# Patient Record
Sex: Female | Born: 1970 | Race: White | Hispanic: No | Marital: Married | Smoking: Current every day smoker
Health system: Southern US, Community
[De-identification: ages and names within clinical notes are randomized; demographics above are authoritative.]

## PROBLEM LIST (undated history)

## (undated) DIAGNOSIS — F32A Depression, unspecified: Secondary | ICD-10-CM

## (undated) DIAGNOSIS — F329 Major depressive disorder, single episode, unspecified: Secondary | ICD-10-CM

## (undated) DIAGNOSIS — E785 Hyperlipidemia, unspecified: Secondary | ICD-10-CM

---

## 2003-01-08 ENCOUNTER — Encounter: Payer: Self-pay | Admitting: Internal Medicine

## 2003-01-08 ENCOUNTER — Encounter: Admission: RE | Admit: 2003-01-08 | Discharge: 2003-01-08 | Payer: Self-pay | Admitting: Internal Medicine

## 2003-02-01 ENCOUNTER — Other Ambulatory Visit: Admission: RE | Admit: 2003-02-01 | Discharge: 2003-02-01 | Payer: Self-pay | Admitting: Internal Medicine

## 2004-12-28 ENCOUNTER — Encounter (INDEPENDENT_AMBULATORY_CARE_PROVIDER_SITE_OTHER): Payer: Self-pay | Admitting: *Deleted

## 2004-12-28 ENCOUNTER — Ambulatory Visit (HOSPITAL_COMMUNITY): Admission: AD | Admit: 2004-12-28 | Discharge: 2004-12-29 | Payer: Self-pay | Admitting: Obstetrics and Gynecology

## 2006-02-01 ENCOUNTER — Inpatient Hospital Stay (HOSPITAL_COMMUNITY): Admission: AD | Admit: 2006-02-01 | Discharge: 2006-02-04 | Payer: Self-pay | Admitting: Obstetrics and Gynecology

## 2006-02-02 ENCOUNTER — Encounter (INDEPENDENT_AMBULATORY_CARE_PROVIDER_SITE_OTHER): Payer: Self-pay | Admitting: *Deleted

## 2008-10-03 ENCOUNTER — Inpatient Hospital Stay (HOSPITAL_COMMUNITY): Admission: AD | Admit: 2008-10-03 | Discharge: 2008-10-05 | Payer: Self-pay | Admitting: Obstetrics and Gynecology

## 2010-02-03 ENCOUNTER — Ambulatory Visit (HOSPITAL_COMMUNITY): Admission: RE | Admit: 2010-02-03 | Discharge: 2010-02-03 | Payer: Self-pay | Admitting: Family Medicine

## 2010-04-10 ENCOUNTER — Other Ambulatory Visit (HOSPITAL_COMMUNITY): Payer: Self-pay | Admitting: Family Medicine

## 2010-04-10 DIAGNOSIS — E041 Nontoxic single thyroid nodule: Secondary | ICD-10-CM

## 2010-06-28 LAB — CBC
HCT: 35.6 % — ABNORMAL LOW (ref 36.0–46.0)
HCT: 40.3 % (ref 36.0–46.0)
Hemoglobin: 12.1 g/dL (ref 12.0–15.0)
Hemoglobin: 13.9 g/dL (ref 12.0–15.0)
MCHC: 34.1 g/dL (ref 30.0–36.0)
MCHC: 34.6 g/dL (ref 30.0–36.0)
MCV: 94.2 fL (ref 78.0–100.0)
MCV: 94.5 fL (ref 78.0–100.0)
Platelets: 201 10*3/uL (ref 150–400)
Platelets: 215 10*3/uL (ref 150–400)
RBC: 3.78 MIL/uL — ABNORMAL LOW (ref 3.87–5.11)
RBC: 4.26 MIL/uL (ref 3.87–5.11)
RDW: 14.1 % (ref 11.5–15.5)
RDW: 13.5 % (ref 11.5–15.5)
WBC: 10 10*3/uL (ref 4.0–10.5)
WBC: 12.7 10*3/uL — ABNORMAL HIGH (ref 4.0–10.5)

## 2010-06-28 LAB — RPR: RPR Ser Ql: NONREACTIVE

## 2010-08-04 NOTE — H&P (Signed)
NAMEBRYNNLIE, UNTERREINER                  ACCOUNT NO.:  [id number]   MEDICAL RECORD NO.:  [id number]          PATIENT TYPE:  INP   LOCATION:  9118                          FACILITY:  [facility]   PHYSICIAN:  Lenoard Aden, M.D.DATE OF BIRTH:  07-31-1970   DATE OF ADMISSION:  10/03/2008  DATE OF DISCHARGE:                              HISTORY & PHYSICAL   CHIEF COMPLAINT:  Labor.   HISTORY OF PRESENT ILLNESS:  A 40 year old white female G9, P2, at 89  plus weeks who presents in active labor.   ALLERGIES:  She has no known drug allergies.   MEDICATIONS:  Prenatal vitamins and Lexapro.   PAST MEDICAL HISTORY:  She has a history of depression.   FAMILY HISTORY:  She has a family history of cancer and heart disease.   SOCIAL HISTORY:  She is a nonsmoker, nondrinker.  She denies domestic or  physical violence.   She has a history of two uncomplicated vaginal births and one pregnancy  loss and one TAb.  Her prenatal course is uncomplicated.   PHYSICAL EXAMINATION:  GENERAL:  She is an uncomfortable-appearing white  female in no acute distress.  HEENT:  Normal.  LUNGS:  Clear.  HEART:  Regular rate and rhythm.  ABDOMEN:  Soft, gravid, and nontender.  Estimated fetal weight 8 pounds.  Cervix is per RN, 8 cm, vertex, intact.  EXTREMITIES:  There are no cords.  NEUROLOGIC:  Nonfocal.  SKIN:  Intact.   IMPRESSION:  A 39 weeks intrauterine pregnancy in active labor.   PLAN:  __________      Lenoard Aden, M.D.  Electronically Signed     RJT/MEDQ  D:  10/03/2008  T:  10/03/2008  Job:  [id number]

## 2010-08-05 ENCOUNTER — Other Ambulatory Visit (HOSPITAL_COMMUNITY): Payer: Self-pay

## 2010-08-07 NOTE — Op Note (Signed)
[name], Shawna Martin                  ACCOUNT NO.:  [id number]   MEDICAL RECORD NO.:  [id number]          PATIENT TYPE:  MAT   LOCATION:  MATC                          FACILITY:  [facility]   PHYSICIAN:  Maxie Better, M.D.DATE OF BIRTH:  1970-10-07   DATE OF PROCEDURE:  12/28/2004  DATE OF DISCHARGE:                                 OPERATIVE REPORT   PREOPERATIVE DIAGNOSES:  1.  Missed abortion.  2.  Heavy vaginal bleeding.   OPERATION/PROCEDURE:  Suction dilation and evacuation.   POSTOPERATIVE DIAGNOSES:  1.  Missed abortion.  2.  Heavy vaginal bleeding.   ANESTHESIA:  MAC, paracervical block.   SURGEON:  Maxie Better, M.D.   INDICATIONS:  This is a 40 year old, gravida 2, para 1, female, missed AB  diagnosed today by ultrasound at seven weeks.  Presented with heavy vaginal  bleeding and now for surgical evaluation and management.  The patient was  scheduled for a dilation and evacuation on December 29, 2004.  Risks and  benefits of the procedure had been explained to the patient and her husband.  Consent has been signed.  The patient was transferred to the operating room.  Her blood type is A positive.   DESCRIPTION OF PROCEDURE:  Under adequate monitored anesthesia, the patient  was placed in the dorsal lithotomy position.  She was sterilely prepped and  draped in the usual fashion.  The bladder was catheterized of a large amount  of urine.  A large amount of clots was removed from the vagina.  Examination  under anesthesia revealed an anteverted, eight-week size uterus and no  adnexal masses could be appreciated.  A bivalve speculum was placed in the  vagina and 21 mL of 1% Nesacaine was injected paracervically at  3 o'clock  and 9 o'clock.  The anterior lip of the cervix was grasped with the single-  tooth tenaculum.  The cervix easily accepted the 30-watt Pratt dilator and 8  mm curved suction cannula was introduced into the uterine cavity.  Products  of conception  was obtained.  The cavity was then curetted, suctioned and  curetted until all tissue was felt to be have been removed at which time all  instruments were then removed from the vagina.  Specimen labeled products of  conception was sent to pathology.  Estimated blood loss from the procedure  itself was minimal.  There were no complications.  The patient tolerated the  procedure well and was transferred to the recovery room in stable condition.      Maxie Better, M.D.  Electronically Signed    [place]/MEDQ  D:  12/28/2004  T:  12/29/2004  Job:  [id number]

## 2011-04-05 ENCOUNTER — Other Ambulatory Visit: Payer: Self-pay | Admitting: Obstetrics and Gynecology

## 2011-04-05 DIAGNOSIS — Z1231 Encounter for screening mammogram for malignant neoplasm of breast: Secondary | ICD-10-CM

## 2011-04-22 ENCOUNTER — Ambulatory Visit
Admission: RE | Admit: 2011-04-22 | Discharge: 2011-04-22 | Disposition: A | Discharge status: Home / Self Care | Payer: BC Managed Care – PPO | Source: Ambulatory Visit | Attending: Obstetrics and Gynecology | Admitting: Obstetrics and Gynecology

## 2011-04-22 DIAGNOSIS — Z1231 Encounter for screening mammogram for malignant neoplasm of breast: Secondary | ICD-10-CM

## 2011-05-04 ENCOUNTER — Other Ambulatory Visit: Payer: Self-pay | Admitting: Dermatology

## 2011-06-13 ENCOUNTER — Emergency Department (HOSPITAL_COMMUNITY)
Admission: EM | Admit: 2011-06-13 | Discharge: 2011-06-13 | Disposition: A | Discharge status: Home / Self Care | Payer: BC Managed Care – PPO | Attending: Emergency Medicine | Admitting: Emergency Medicine

## 2011-06-13 ENCOUNTER — Encounter (HOSPITAL_COMMUNITY): Payer: Self-pay

## 2011-06-13 DIAGNOSIS — W261XXA Contact with sword or dagger, initial encounter: Secondary | ICD-10-CM | POA: Insufficient documentation

## 2011-06-13 DIAGNOSIS — S61209A Unspecified open wound of unspecified finger without damage to nail, initial encounter: Secondary | ICD-10-CM | POA: Insufficient documentation

## 2011-06-13 DIAGNOSIS — S61217A Laceration without foreign body of left little finger without damage to nail, initial encounter: Secondary | ICD-10-CM

## 2011-06-13 DIAGNOSIS — Y998 Other external cause status: Secondary | ICD-10-CM | POA: Insufficient documentation

## 2011-06-13 DIAGNOSIS — W260XXA Contact with knife, initial encounter: Secondary | ICD-10-CM | POA: Insufficient documentation

## 2011-06-13 DIAGNOSIS — Y93G1 Activity, food preparation and clean up: Secondary | ICD-10-CM | POA: Insufficient documentation

## 2011-06-13 NOTE — ED Provider Notes (Signed)
History     CSN: [id number]  Arrival date & time 06/13/11  1820   First MD Initiated Contact with Patient 06/13/11 1935      Chief Complaint  Patient presents with  . Laceration    (Consider location/radiation/quality/duration/timing/severity/associated sxs/prior treatment) Patient is a 42 y.o. female presenting with skin laceration. The history is provided by the patient.  Laceration  The incident occurred 1 to 2 hours ago. The laceration is 2 cm in size. The laceration mechanism was a a dirty knife. The pain is at a severity of 2/10.  Pt was cutting chicken and cut her left 5th finger with a knife. Laceration to the tip of the finger, bleeding, gaping, no nail involvement. Bleeding now controlled. No numbness distal to the cut. No other complaints.  History reviewed. No pertinent past medical history.  History reviewed. No pertinent past surgical history.  No family history on file.  History  Substance Use Topics  . Smoking status: Not on file  . Smokeless tobacco: Not on file  . Alcohol Use: Not on file    OB History    Grav Para Term Preterm Abortions TAB SAB Ect Mult Living                  Review of Systems  Constitutional: Negative for fever and chills.  HENT: Negative.   Eyes: Negative.   Respiratory: Negative.   Cardiovascular: Negative.   Gastrointestinal: Negative.   Musculoskeletal: Negative.   Skin: Positive for wound.  Neurological: Negative for weakness and numbness.  Psychiatric/Behavioral: Negative.     Allergies  Review of patient's allergies indicates not on file.  Home Medications  No current outpatient prescriptions on file.  BP 122/74  Pulse 73  Temp(Src) 98.9 F (37.2 C) (Oral)  Resp 20  SpO2 99%  Physical Exam  Nursing note and vitals reviewed. Constitutional: She is oriented to person, place, and time. She appears well-developed and well-nourished.  Neck: Neck supple.  Cardiovascular: Normal rate, regular rhythm and normal  heart sounds.   Pulmonary/Chest: Effort normal and breath sounds normal. No respiratory distress.  Musculoskeletal: Normal range of motion.       See skin exam  Neurological: She is alert and oriented to person, place, and time.  Skin: Skin is warm and dry.       2 cm laceration over the tip/distal left 5th finger, no nail involvement. Good cap refill and sensation distal to the laceration.  Psychiatric: She has a normal mood and affect.    ED Course  Procedures (including critical care time)  LACERATION REPAIR Performed by: Lottie Mussel Authorized by: Jaynie Crumble A Consent: Verbal consent obtained. Risks and benefits: risks, benefits and alternatives were discussed Consent given by: patient Patient identity confirmed: provided demographic data Prepped and Draped in normal sterile fashion Wound explored  Laceration Location: distal left 5th finger  Laceration Length: 2cm  No Foreign Bodies seen or palpated  Anesthesia: local infiltration  Local anesthetic: lidocaine 2% wo epinephrine  Anesthetic total: 1 ml  Irrigation method: syringe Amount of cleaning: standard  Skin closure: prolene 5.0  Number of sutures: 3  Technique: simple interrupted  Patient tolerance: Patient tolerated the procedure well with no immediate complications.   1. Laceration of fifth finger, left       MDM          Lottie Mussel, PA 06/14/11 0124

## 2011-06-13 NOTE — ED Notes (Signed)
Patient here with laceration to left fifth digit on knife. Bleeding controlled with dressing

## 2011-06-13 NOTE — Discharge Instructions (Signed)
Keep finger clean and dry. Apply bacitracin twice a day. Watch for signs of infection. Keep it covered. Follow up in 7 days for suture removal.   Laceration Care, Adult A laceration is a cut or lesion that goes through all layers of the skin and into the tissue just beneath the skin. TREATMENT  Some lacerations may not require closure. Some lacerations may not be able to be closed due to an increased risk of infection. It is important to see your caregiver as soon as possible after an injury to minimize the risk of infection and maximize the opportunity for successful closure. If closure is appropriate, pain medicines may be given, if needed. The wound will be cleaned to help prevent infection. Your caregiver will use stitches (sutures), staples, wound glue (adhesive), or skin adhesive strips to repair the laceration. These tools bring the skin edges together to allow for faster healing and a better cosmetic outcome. However, all wounds will heal with a scar. Once the wound has healed, scarring can be minimized by covering the wound with sunscreen during the day for 1 full year. HOME CARE INSTRUCTIONS  For sutures or staples:  Keep the wound clean and dry.   If you were given a bandage (dressing), you should change it at least once a day. Also, change the dressing if it becomes wet or dirty, or as directed by your caregiver.   Wash the wound with soap and water 2 times a day. Rinse the wound off with water to remove all soap. Pat the wound dry with a clean towel.   After cleaning, apply a thin layer of the antibiotic ointment as recommended by your caregiver. This will help prevent infection and keep the dressing from sticking.   You may shower as usual after the first 24 hours. Do not soak the wound in water until the sutures are removed.   Only take over-the-counter or prescription medicines for pain, discomfort, or fever as directed by your caregiver.   Get your sutures or staples removed as  directed by your caregiver.  For skin adhesive strips:  Keep the wound clean and dry.   Do not get the skin adhesive strips wet. You may bathe carefully, using caution to keep the wound dry.   If the wound gets wet, pat it dry with a clean towel.   Skin adhesive strips will fall off on their own. You may trim the strips as the wound heals. Do not remove skin adhesive strips that are still stuck to the wound. They will fall off in time.  For wound adhesive:  You may briefly wet your wound in the shower or bath. Do not soak or scrub the wound. Do not swim. Avoid periods of heavy perspiration until the skin adhesive has fallen off on its own. After showering or bathing, gently pat the wound dry with a clean towel.   Do not apply liquid medicine, cream medicine, or ointment medicine to your wound while the skin adhesive is in place. This may loosen the film before your wound is healed.   If a dressing is placed over the wound, be careful not to apply tape directly over the skin adhesive. This may cause the adhesive to be pulled off before the wound is healed.   Avoid prolonged exposure to sunlight or tanning lamps while the skin adhesive is in place. Exposure to ultraviolet light in the first year will darken the scar.   The skin adhesive will usually remain in place  for 5 to 10 days, then naturally fall off the skin. Do not pick at the adhesive film.  You may need a tetanus shot if:  You cannot remember when you had your last tetanus shot.   You have never had a tetanus shot.  If you get a tetanus shot, your arm may swell, get red, and feel warm to the touch. This is common and not a problem. If you need a tetanus shot and you choose not to have one, there is a rare chance of getting tetanus. Sickness from tetanus can be serious. SEEK MEDICAL CARE IF:   You have redness, swelling, or increasing pain in the wound.   You see a red line that goes away from the wound.   You have  yellowish-white fluid (pus) coming from the wound.   You have a fever.   You notice a bad smell coming from the wound or dressing.   Your wound breaks open before or after sutures have been removed.   You notice something coming out of the wound such as wood or glass.   Your wound is on your hand or foot and you cannot move a finger or toe.  SEEK IMMEDIATE MEDICAL CARE IF:   Your pain is not controlled with prescribed medicine.   You have severe swelling around the wound causing pain and numbness or a change in color in your arm, hand, leg, or foot.   Your wound splits open and starts bleeding.   You have worsening numbness, weakness, or loss of function of any joint around or beyond the wound.   You develop painful lumps near the wound or on the skin anywhere on your body.  MAKE SURE YOU:   Understand these instructions.   Will watch your condition.   Will get help right away if you are not doing well or get worse.  Document Released: 03/08/2005 Document Revised: 02/25/2011 Document Reviewed: 09/01/2010 [facility] Patient Information 2012 [place], [place].

## 2011-06-16 NOTE — ED Provider Notes (Signed)
Medical screening examination/treatment/procedure(s) were performed by non-physician practitioner and as supervising physician I was immediately available for consultation/collaboration.  Lorann Tani T Detrell Umscheid, MD 06/16/11 1846 

## 2011-07-29 ENCOUNTER — Other Ambulatory Visit: Payer: Self-pay | Admitting: Family Medicine

## 2011-07-29 DIAGNOSIS — E049 Nontoxic goiter, unspecified: Secondary | ICD-10-CM

## 2011-08-05 ENCOUNTER — Ambulatory Visit
Admission: RE | Admit: 2011-08-05 | Discharge: 2011-08-05 | Disposition: A | Discharge status: Home / Self Care | Payer: BC Managed Care – PPO | Source: Ambulatory Visit | Attending: Family Medicine | Admitting: Family Medicine

## 2011-08-05 DIAGNOSIS — E049 Nontoxic goiter, unspecified: Secondary | ICD-10-CM

## 2012-04-06 IMAGING — US US SOFT TISSUE HEAD/NECK
1 series · 14 of 25 positions shown · non-contrast
Comparison: 01/08/2003 by report only

CLINICAL DATA: Thyromegaly and goiter

THYROID ULTRASOUND
TECHNIQUE: Ultrasound examination of the thyroid gland and adjacent
soft tissues was performed.

[Series 1: us soft tissue head/neck · 0.07mm/px · 14 of 48 slices shown]
[im 1/48]
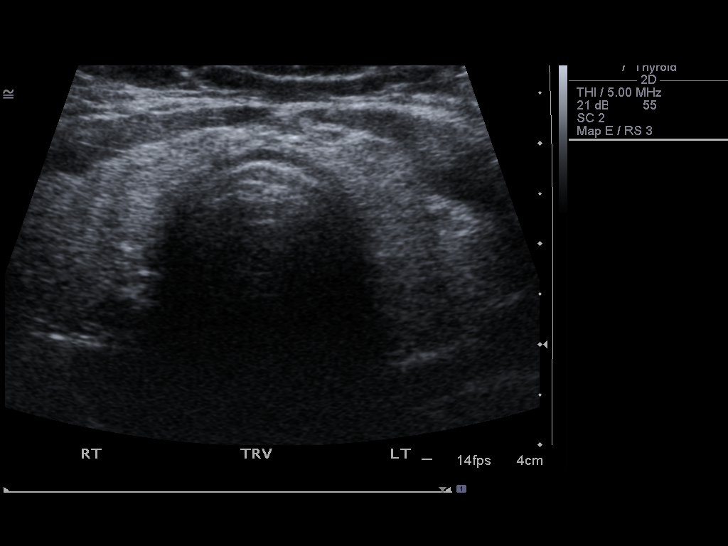
[im 4/48]
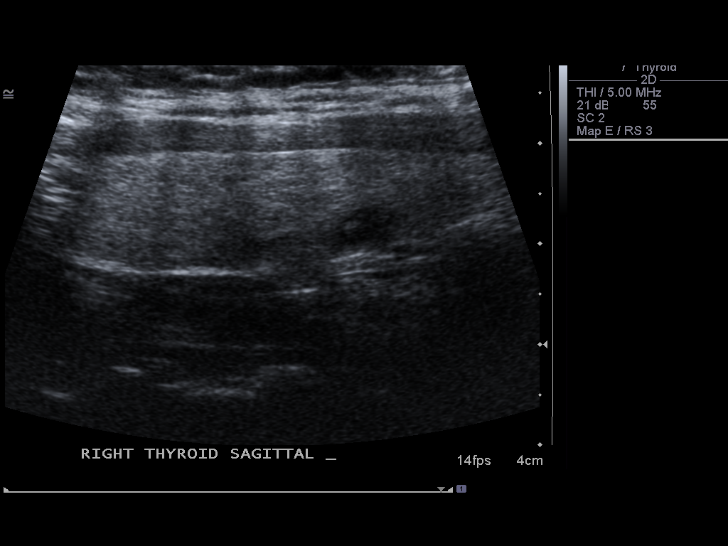
[im 8/48]
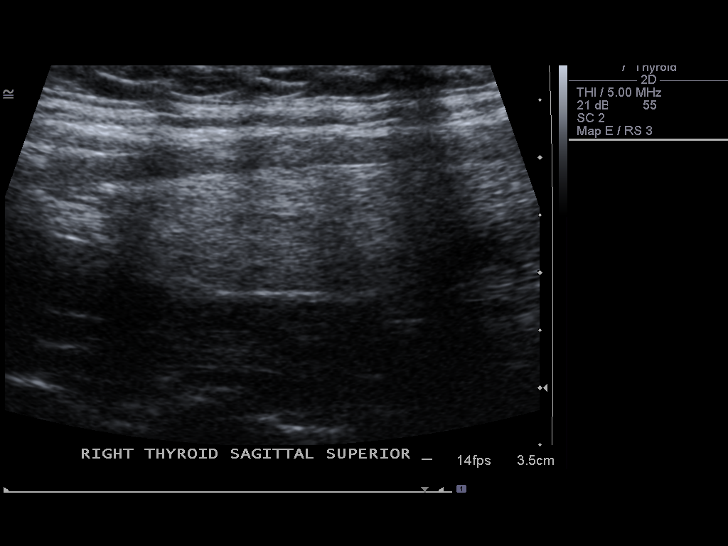
[im 12/48]
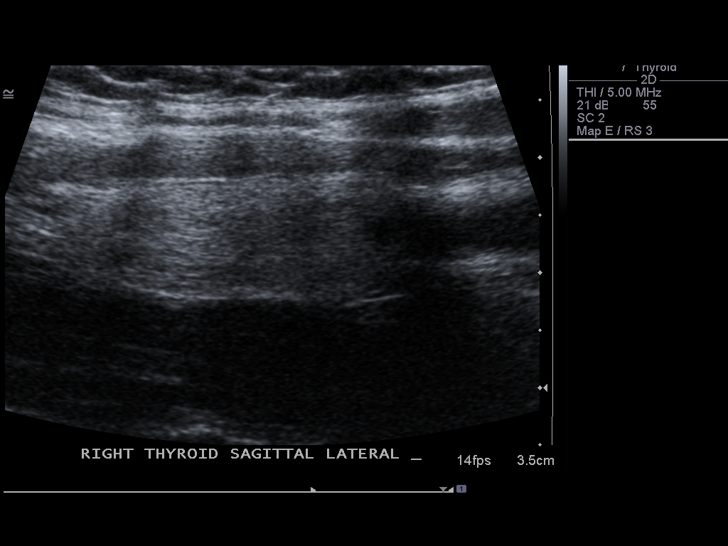
[im 16/48]
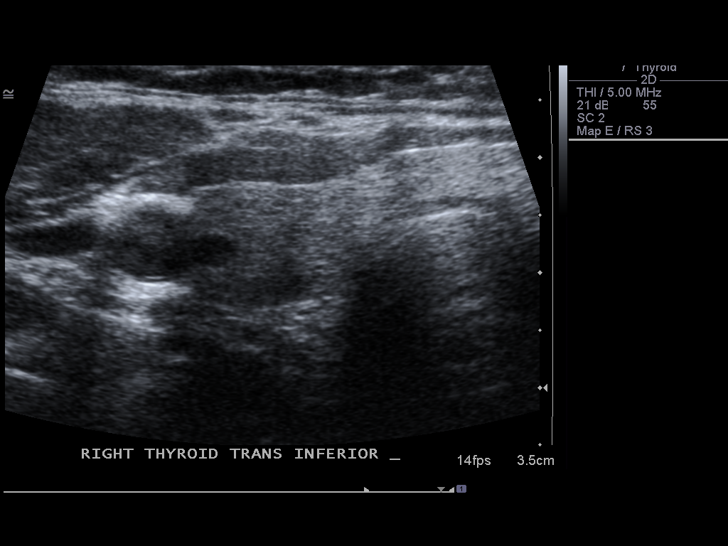
[im 18/48]
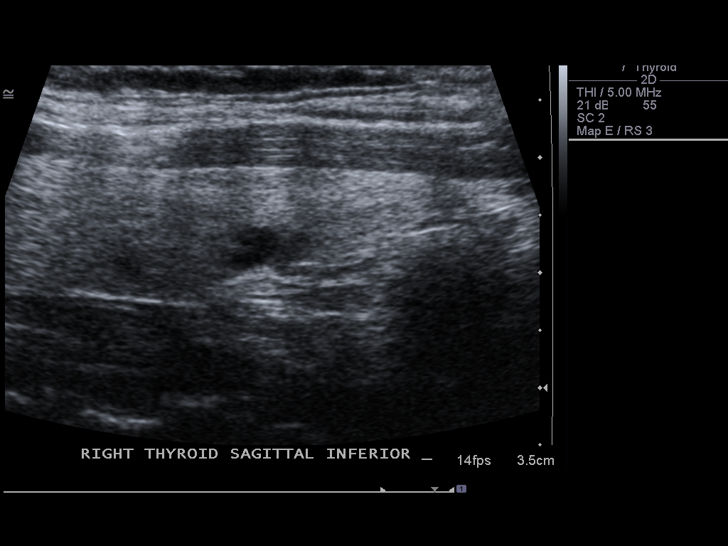
[im 22/48]
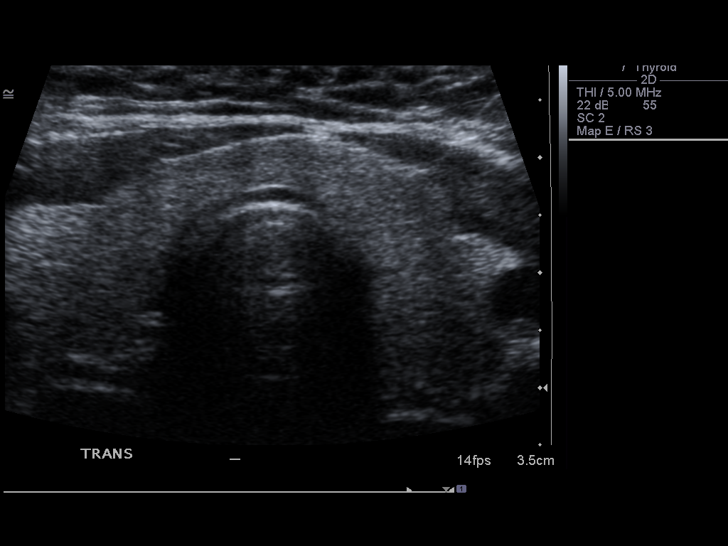
[im 26/48]
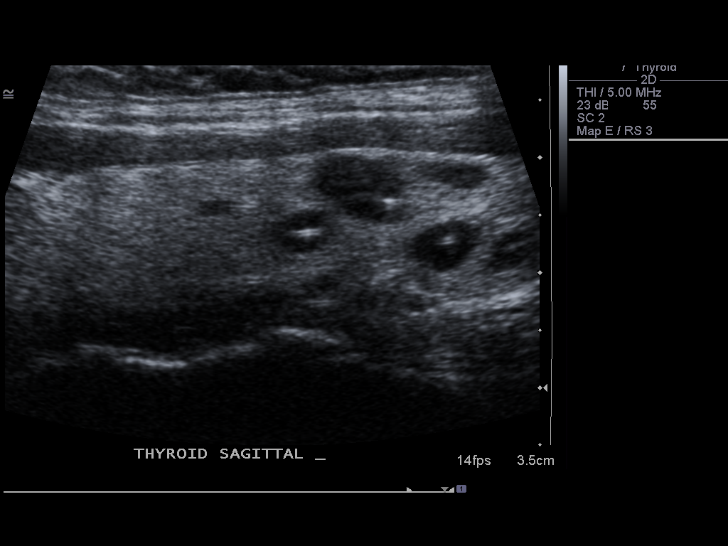
[im 30/48]
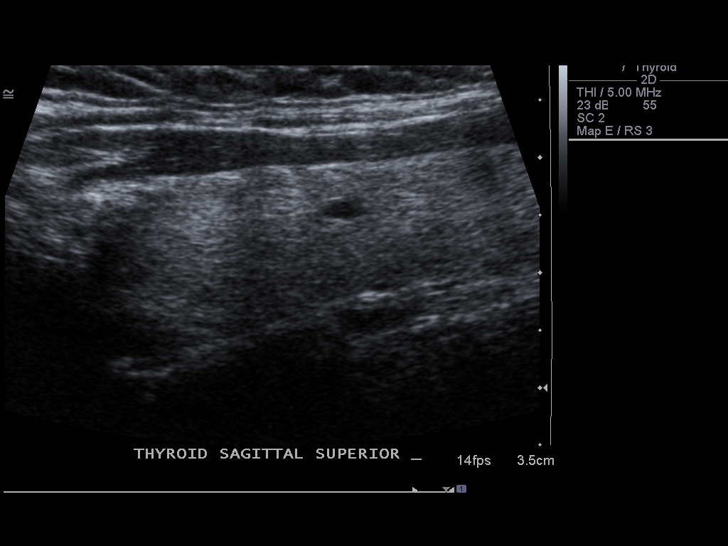
[im 32/48]
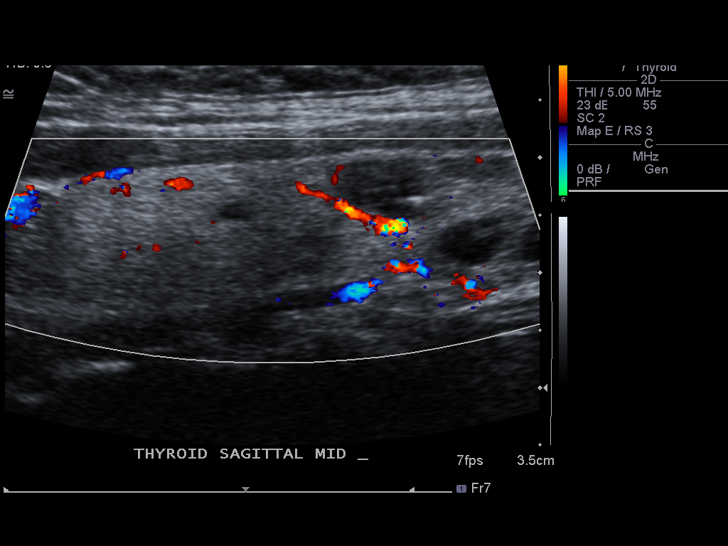
[im 36/48]
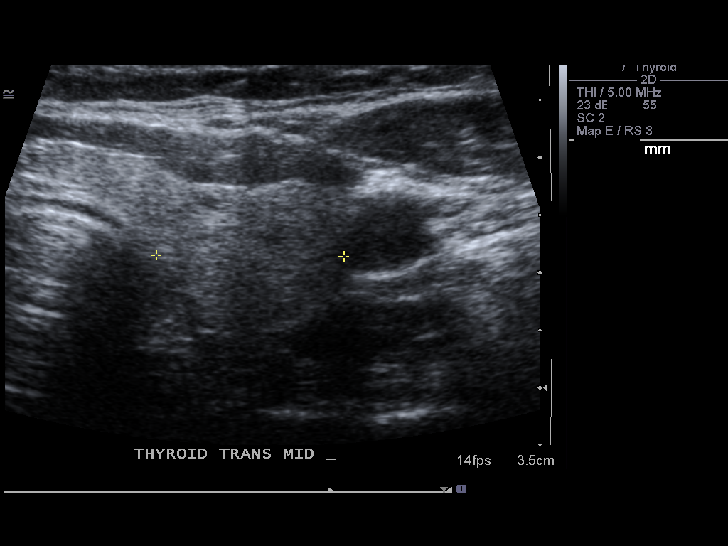
[im 40/48]
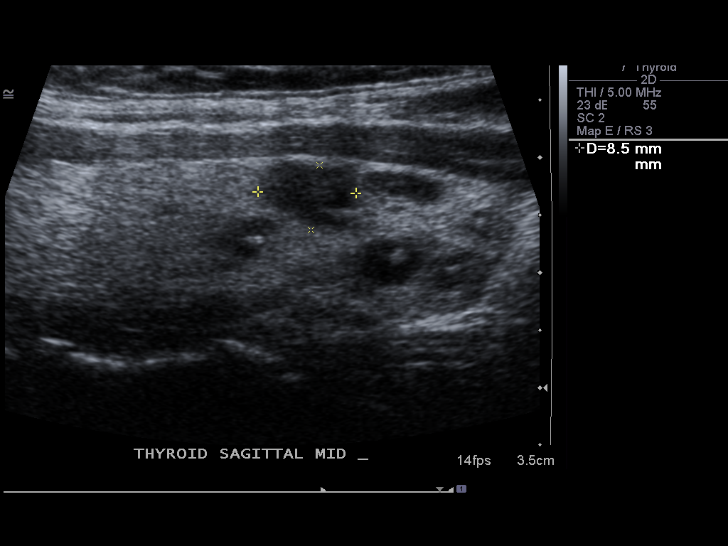
[im 44/48]
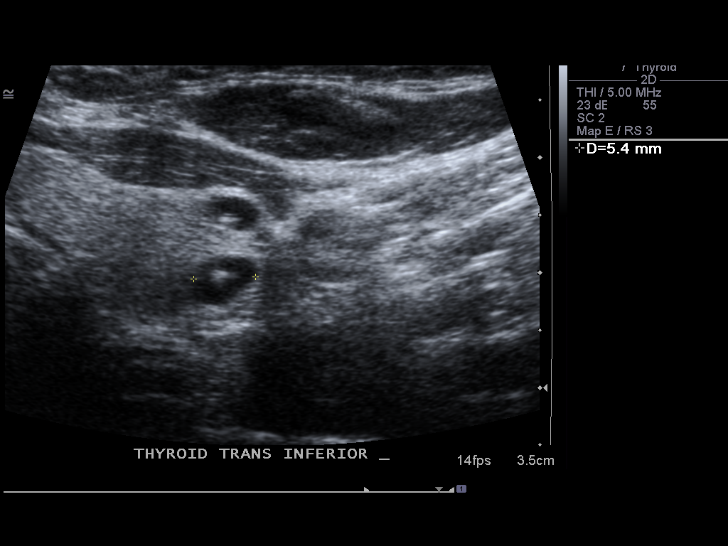
[im 48/48]
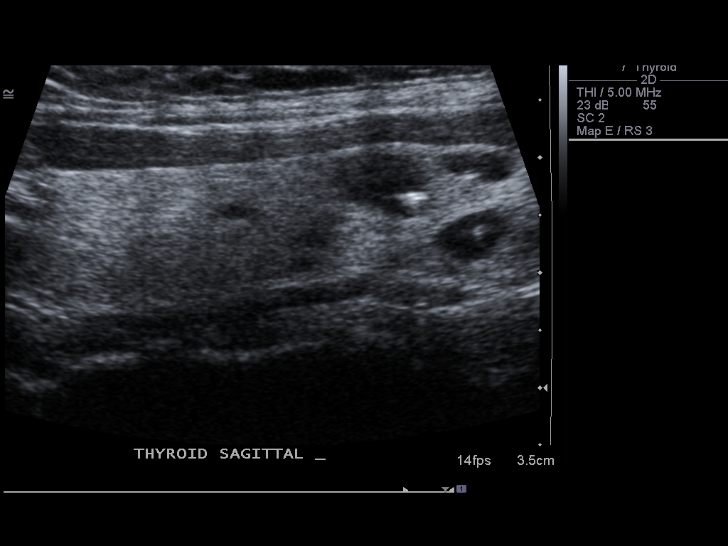

[14 of 25 positions shown; findings below may reference images not displayed]

FINDINGS: Right thyroid lobe:  13 x 17 x 45 mm
Left thyroid lobe:  12 x 16 x 47 mm
Isthmus:  5 mm, unremarkable

Focal nodules:   4 mm hypoechoic or cystic lesion, mid-right
6 x 8 x 9 mm hypoechoic or cystic lesion, mid left
There are several smaller hypoechoic or cystic left lobe lesions
measuring 7mm or less.

Lymphadenopathy:  None visualized.
IMPRESSION: 1.  Bilateral small thyroid nodules or cysts.  None meet consensus
criteria for biopsy.  Continued surveillance recommended. This
recommendation follows the consensus statement:  Management of
Thyroid Nodules Detected as US:  Society of [employer]iologists in
800.  Available online at :
[URL]

## 2012-12-16 ENCOUNTER — Emergency Department (HOSPITAL_COMMUNITY)
Admission: EM | Admit: 2012-12-16 | Discharge: 2012-12-16 | Disposition: A | Discharge status: Home / Self Care | Payer: BC Managed Care – PPO | Source: Home / Self Care | Attending: Emergency Medicine | Admitting: Emergency Medicine

## 2012-12-16 ENCOUNTER — Encounter (HOSPITAL_COMMUNITY): Payer: Self-pay | Admitting: Emergency Medicine

## 2012-12-16 DIAGNOSIS — T63481A Toxic effect of venom of other arthropod, accidental (unintentional), initial encounter: Secondary | ICD-10-CM

## 2012-12-16 DIAGNOSIS — T6391XA Toxic effect of contact with unspecified venomous animal, accidental (unintentional), initial encounter: Secondary | ICD-10-CM

## 2012-12-16 HISTORY — DX: Major depressive disorder, single episode, unspecified: F32.9

## 2012-12-16 HISTORY — DX: Depression, unspecified: F32.A

## 2012-12-16 HISTORY — DX: Hyperlipidemia, unspecified: E78.5

## 2012-12-16 MED ORDER — OXYCODONE-ACETAMINOPHEN 5-325 MG PO TABS: ORAL_TABLET | ORAL | Status: DC

## 2012-12-16 MED ORDER — PREDNISONE 20 MG PO TABS
ORAL_TABLET | ORAL | Status: AC
  Filled 2012-12-16: qty 3

## 2012-12-16 MED ORDER — EPINEPHRINE 0.3 MG/0.3ML IJ SOAJ: 0.3000 mg | Freq: Once | INTRAMUSCULAR | Status: DC

## 2012-12-16 MED ORDER — PREDNISONE 20 MG PO TABS: ORAL_TABLET | ORAL | Status: DC

## 2012-12-16 MED ORDER — PREDNISONE 20 MG PO TABS
60.0000 mg | ORAL_TABLET | Freq: Once | ORAL | Status: AC
  Administered 2012-12-16: 60 mg via ORAL

## 2012-12-16 NOTE — ED Notes (Signed)
Reports being at playground when a wasp flew up under her blouse, stinging her left abdomen approx 4 hrs ago.  Large area of swelling noted; states area is steadily getting worse.

## 2012-12-16 NOTE — ED Provider Notes (Signed)
Chief Complaint:   Chief Complaint  Patient presents with  . Insect Bite    History of Present Illness:   Shawna Martin is a 42 year old female who was stung by a flying insect around 3 PM on the left upper quadrant of her abdomen. She did not see exactly what it was that stung her. It was a small, flying insect. It flew up underneath her blouse. She was at a children's playground when this happened. Right now she has a large, painful, itchy spot in the left upper quadrant of her abdomen. She denies any generalized urticaria or rash, difficulty breathing, tightness, or wheezing. She's had no swelling of the lips, tongue, or throat. The patient states she had a similar insect sting reaction several weeks ago which involved her left leg. Ultimately her entire leg swelled up and she was concerned that the same thing will happen with this sting. She has an appointment to see an allergist, but has not been in yet.  Review of Systems:  Other than noted above, the patient denies any of the following symptoms: Systemic:  No fever, chills, sweats, weight loss, or fatigue. ENT:  No nasal congestion, rhinorrhea, sore throat, swelling of lips, tongue or throat. Resp:  No cough, wheezing, or shortness of breath. Skin:  No rash, itching, nodules, or suspicious lesions.  PMFSH:  Past medical history, family history, social history, meds, and allergies were reviewed. She takes Lexapro and Pravachol. She has elevated cholesterol. She smokes a half a pack of cigarettes a day.  Physical Exam:   Vital signs:  BP 142/88  Pulse 96  Temp(Src) 99.5 F (37.5 C) (Oral)  Resp 20  SpO2 95% Gen:  Alert, oriented, in no distress. ENT:  Pharynx clear, no intraoral lesions, moist mucous membranes. Lungs:  Clear to auscultation. Skin:  In the left upper quadrant of the abdomen there is a 10 x 10 cm, raised, red, slightly tender, erythematous insect staying. No stinger was visible. This was tender to touch. Skin was  otherwise clear and free of any urticaria.  Course in Urgent Care Center:   She has are taking Benadryl 25 mg at home and she was be driving herself home from here. She was given prednisone 60 mg by mouth.  Assessment:  The encounter diagnosis was Hymenoptera sting, initial encounter.  This is a severe local reaction to insect sting. No evidence of systemic symptoms, urticaria, or anaphylaxis.  Plan:   1.  Meds:  The following meds were prescribed:   Discharge Medication List as of 12/16/2012  7:54 PM    START taking these medications   Details  oxyCODONE-acetaminophen (PERCOCET) 5-325 MG per tablet 1 to 2 tablets every 6 hours as needed for pain., Print    predniSONE (DELTASONE) 20 MG tablet 3 daily for 3 days, 2 daily for 3 days, 1 daily for 3 days, Normal       She was also given a prescription for an EpiPen to use as needed.  2.  Patient Education/Counseling:  The patient was given appropriate handouts, self care instructions, and instructed in symptomatic relief.  Suggested rest, application of ice, and to be on the lookout for any evidence of difficulty breathing, urticaria, or swelling of the lips, tongue, or throat.  3.  Follow up:  The patient was told to follow up if no better in 3 to 4 days, if becoming worse in any way, and given some red flag symptoms such as urticaria, difficulty breathing, swelling of  the lips, tongue, throat, or airway area which would prompt immediate return.  Follow up with her allergist.    \  Reuben Likes, MD 12/16/12 2114

## 2013-10-06 IMAGING — US US SOFT TISSUE HEAD/NECK
1 series · 14 of 25 positions shown · non-contrast
Comparison: Ultrasound of the thyroid of 02/03/2010

CLINICAL DATA: Thyroid goiter, follow-up

THYROID ULTRASOUND
TECHNIQUE: Ultrasound examination of the thyroid gland and adjacent
soft tissues was performed.

[Series 1: us soft tissue head/neck · 0.09mm/px · 14 of 62 slices shown]
[im 1/62]
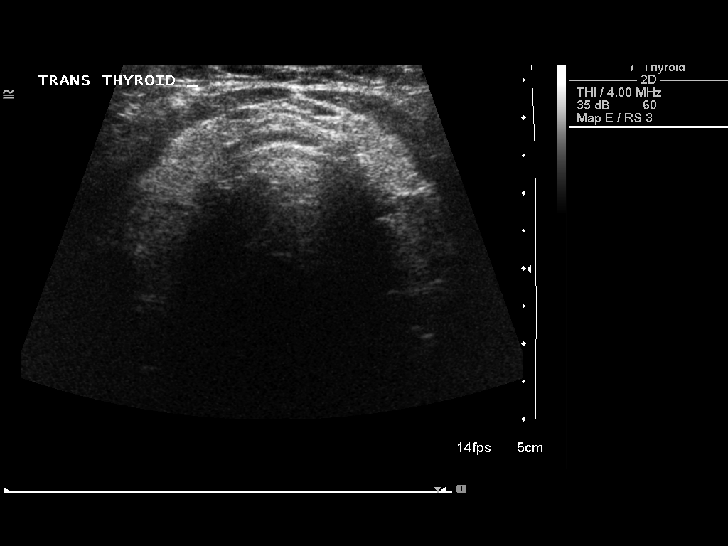
[im 6/62]
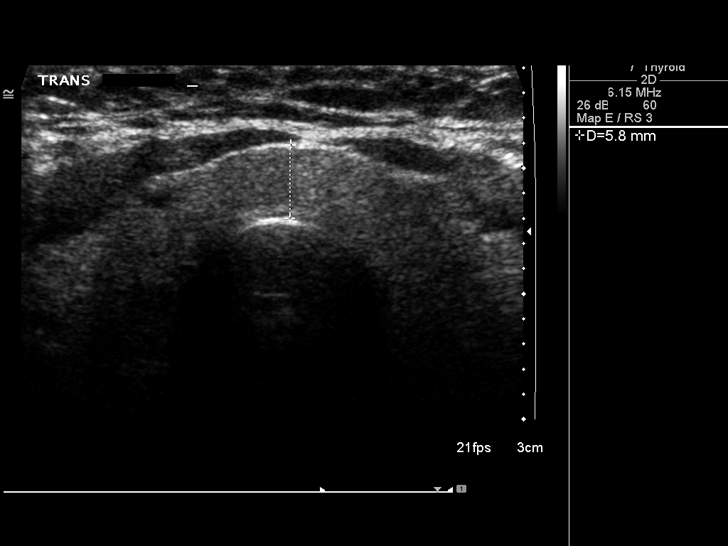
[im 11/62]
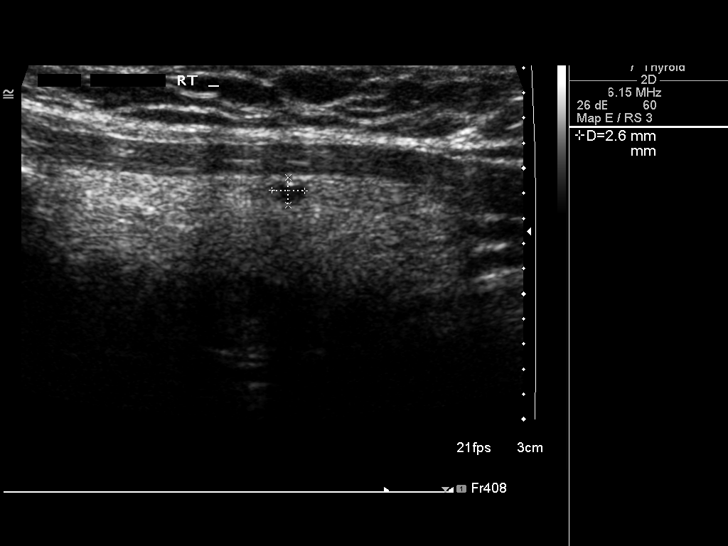
[im 16/62]
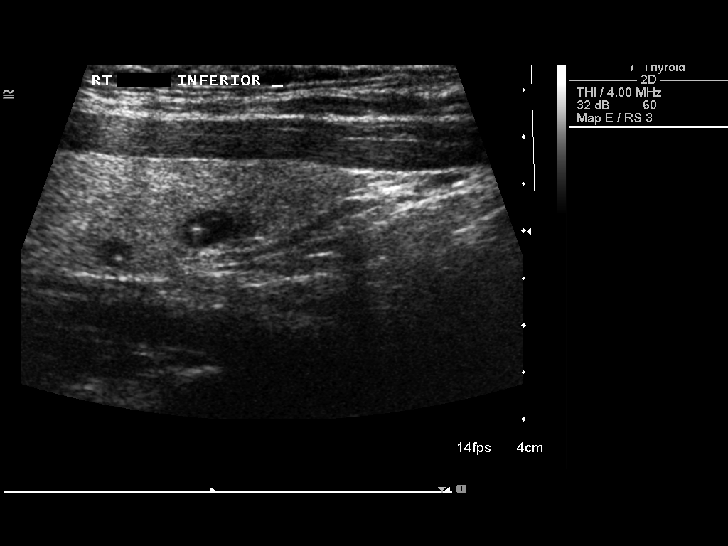
[im 21/62]
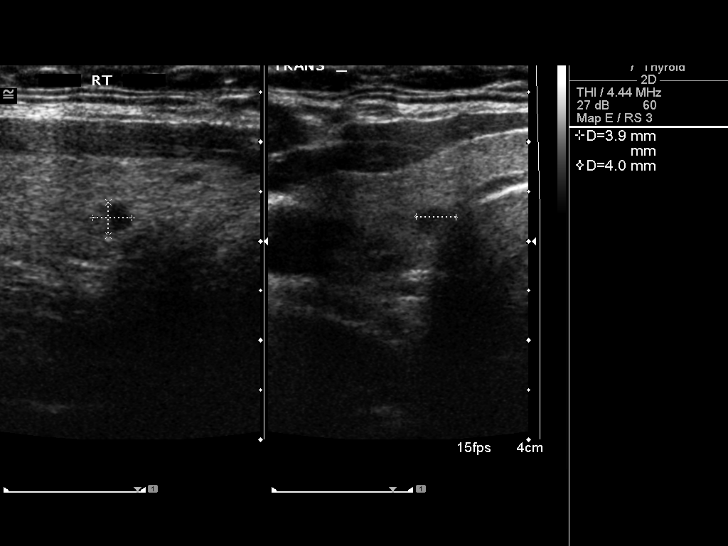
[im 23/62]
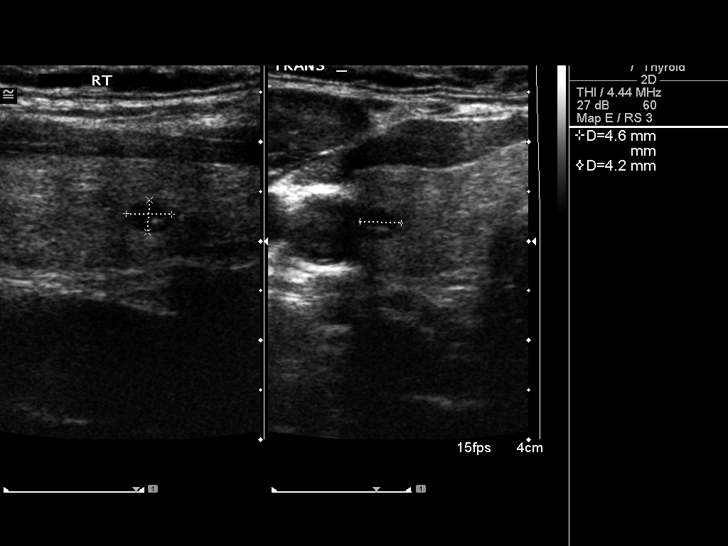
[im 28/62]
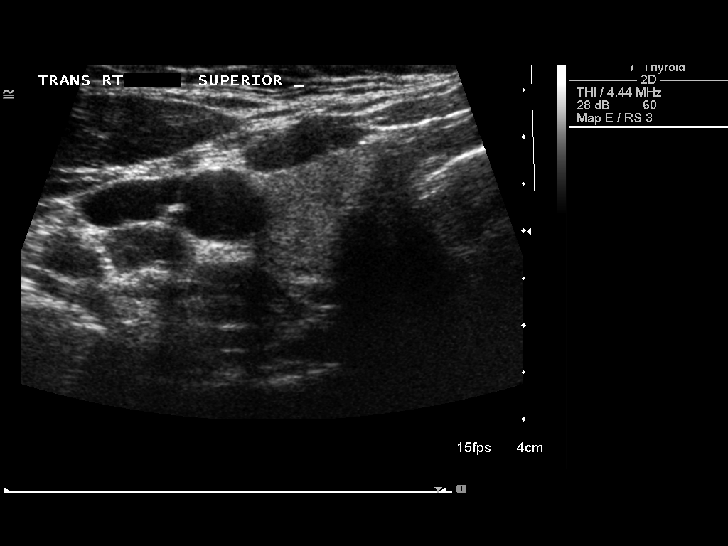
[im 34/62]
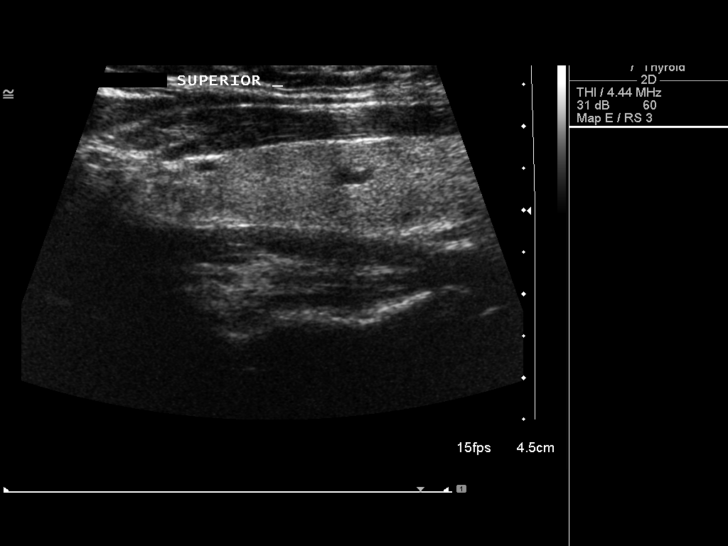
[im 39/62]
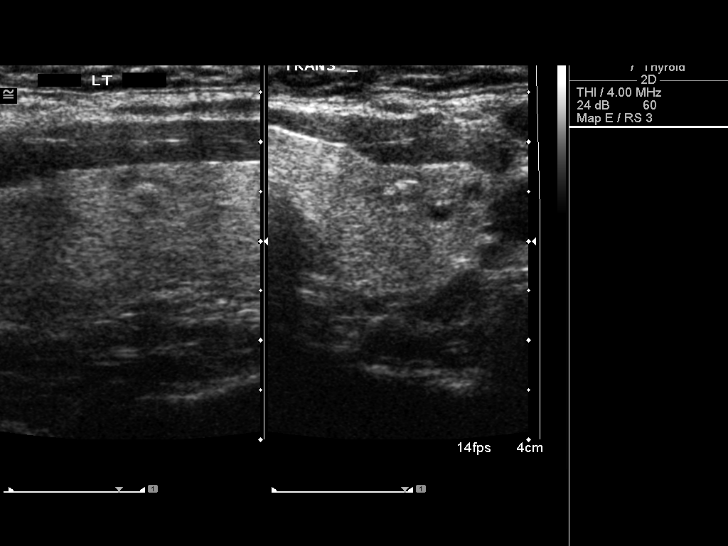
[im 41/62]
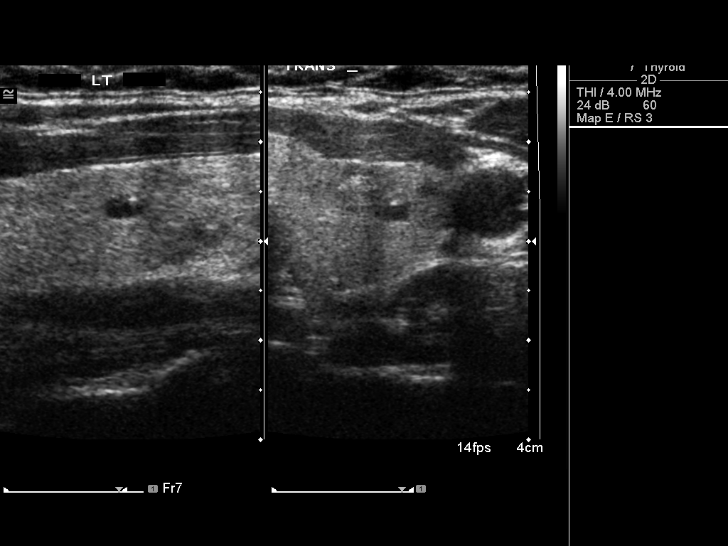
[im 46/62]
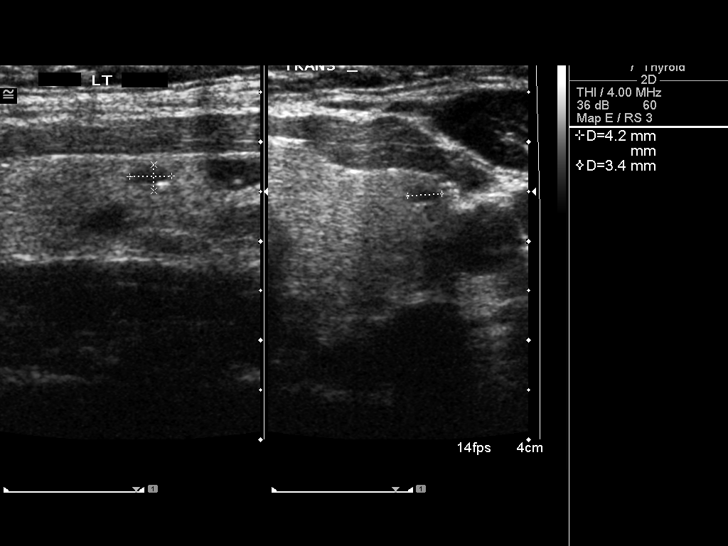
[im 51/62]
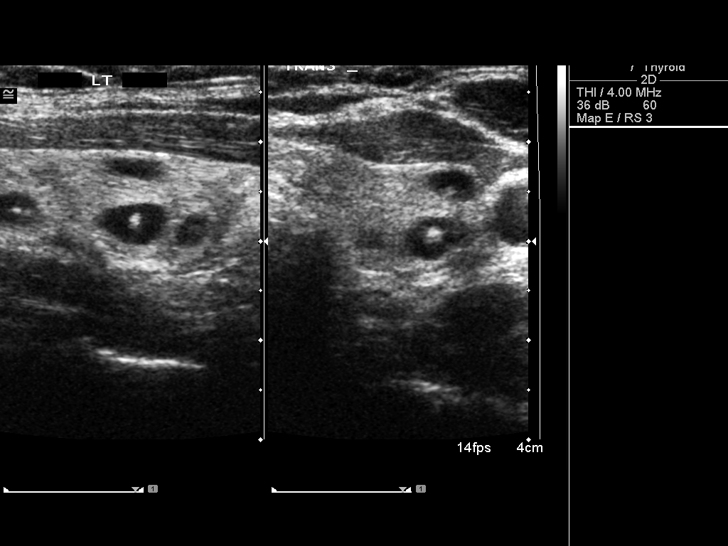
[im 56/62]
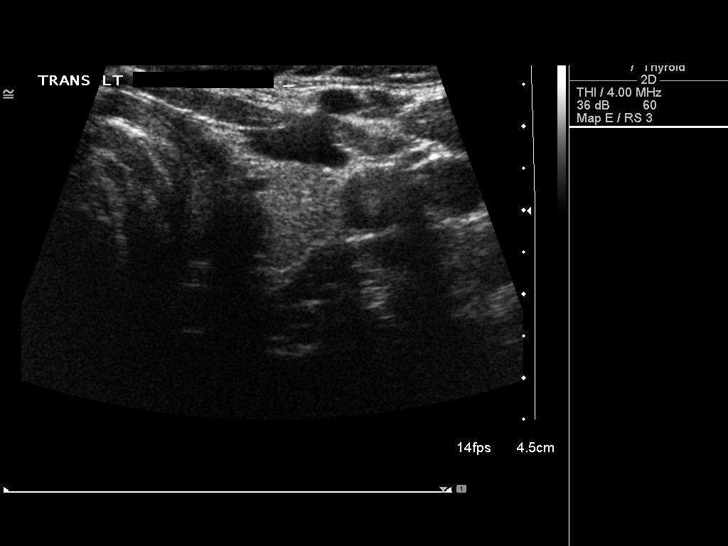
[im 62/62]
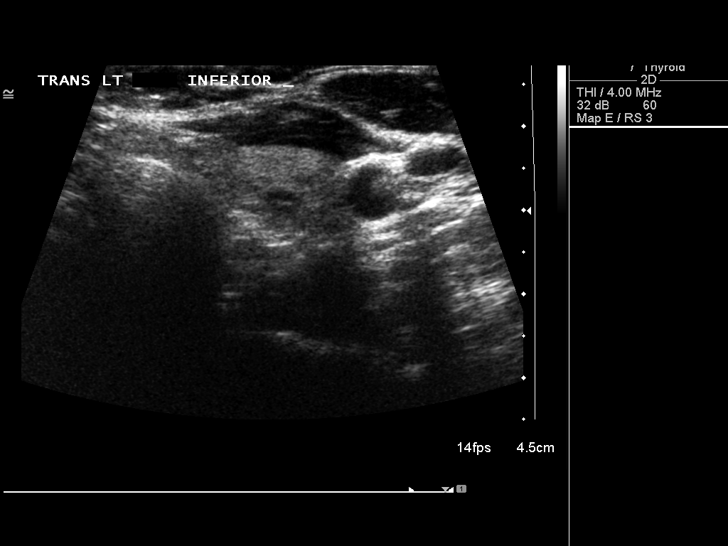

[14 of 25 positions shown; findings below may reference images not displayed]

FINDINGS: Right thyroid lobe:  4.7 x 1.2 x 1.7 cm.  (Previously 4.5 x 1.3 x
1.7 cm).
Left thyroid lobe:  6.0 x 1.2 x 1.9 cm.  (Previously 4.7 x 1.2 x
1.6 cm).
Isthmus:  6 mm in thickness.

Focal nodules:  The echogenicity of the thyroid gland is
inhomogeneous.  There are multiple hypoechoic nodules bilaterally,
none measuring larger than 8 mm in diameter.

Lymphadenopathy:  None visualized.
IMPRESSION: Multiple hypoechoic thyroid nodules of no more than 8 mm in
diameter bilaterally.

## 2016-02-26 ENCOUNTER — Emergency Department (HOSPITAL_COMMUNITY)
Admission: EM | Admit: 2016-02-26 | Discharge: 2016-02-26 | Disposition: A | Discharge status: Home / Self Care | Payer: BLUE CROSS/BLUE SHIELD | Attending: Emergency Medicine | Admitting: Emergency Medicine

## 2016-02-26 DIAGNOSIS — T782XXA Anaphylactic shock, unspecified, initial encounter: Secondary | ICD-10-CM | POA: Diagnosis not present

## 2016-02-26 DIAGNOSIS — F17210 Nicotine dependence, cigarettes, uncomplicated: Secondary | ICD-10-CM | POA: Insufficient documentation

## 2016-02-26 DIAGNOSIS — R300 Dysuria: Secondary | ICD-10-CM | POA: Diagnosis present

## 2016-02-26 LAB — URINALYSIS, ROUTINE W REFLEX MICROSCOPIC
BILIRUBIN URINE: NEGATIVE
Bacteria, UA: NONE SEEN
Glucose, UA: NEGATIVE mg/dL
KETONES UR: NEGATIVE mg/dL
Nitrite: NEGATIVE
Protein, ur: NEGATIVE mg/dL
SPECIFIC GRAVITY, URINE: 1.006 (ref 1.005–1.030)
SQUAMOUS EPITHELIAL / LPF: NONE SEEN
pH: 5 (ref 5.0–8.0)

## 2016-02-26 MED ORDER — ONDANSETRON HCL 4 MG/2ML IJ SOLN
4.0000 mg | Freq: Once | INTRAMUSCULAR | Status: AC
  Administered 2016-02-26: 4 mg via INTRAVENOUS
  Filled 2016-02-26: qty 2

## 2016-02-26 MED ORDER — FAMOTIDINE IN NACL 20-0.9 MG/50ML-% IV SOLN
20.0000 mg | Freq: Once | INTRAVENOUS | Status: AC
  Administered 2016-02-26: 20 mg via INTRAVENOUS
  Filled 2016-02-26: qty 50

## 2016-02-26 MED ORDER — FAMOTIDINE 20 MG PO TABS: 20.0000 mg | ORAL_TABLET | Freq: Two times a day (BID) | ORAL | 0 refills | Status: AC

## 2016-02-26 MED ORDER — EPINEPHRINE 0.3 MG/0.3ML IJ SOAJ
0.3000 mg | Freq: Once | INTRAMUSCULAR | Status: AC
  Administered 2016-02-26: 0.3 mg via INTRAMUSCULAR

## 2016-02-26 MED ORDER — EPINEPHRINE 0.3 MG/0.3ML IJ SOAJ: 0.3000 mg | Freq: Once | INTRAMUSCULAR | 3 refills | Status: AC

## 2016-02-26 MED ORDER — METHYLPREDNISOLONE SODIUM SUCC 125 MG IJ SOLR
INTRAMUSCULAR | Status: AC
  Filled 2016-02-26: qty 2

## 2016-02-26 MED ORDER — FENTANYL CITRATE (PF) 100 MCG/2ML IJ SOLN
50.0000 ug | Freq: Once | INTRAMUSCULAR | Status: AC
  Administered 2016-02-26: 50 ug via INTRAVENOUS
  Filled 2016-02-26: qty 2

## 2016-02-26 MED ORDER — DIPHENHYDRAMINE HCL 50 MG/ML IJ SOLN
50.0000 mg | Freq: Once | INTRAMUSCULAR | Status: AC
  Administered 2016-02-26: 50 mg via INTRAVENOUS
  Filled 2016-02-26: qty 1

## 2016-02-26 MED ORDER — CEPHALEXIN 250 MG PO CAPS
500.0000 mg | ORAL_CAPSULE | Freq: Once | ORAL | Status: AC
  Administered 2016-02-26: 500 mg via ORAL
  Filled 2016-02-26: qty 2

## 2016-02-26 MED ORDER — DIPHENHYDRAMINE HCL 25 MG PO TABS: 25.0000 mg | ORAL_TABLET | Freq: Four times a day (QID) | ORAL | 0 refills | Status: AC

## 2016-02-26 MED ORDER — SODIUM CHLORIDE 0.9 % IV BOLUS (SEPSIS)
1000.0000 mL | Freq: Once | INTRAVENOUS | Status: AC
  Administered 2016-02-26: 1000 mL via INTRAVENOUS

## 2016-02-26 MED ORDER — METHYLPREDNISOLONE SODIUM SUCC 125 MG IJ SOLR
125.0000 mg | Freq: Once | INTRAMUSCULAR | Status: AC
  Administered 2016-02-26: 125 mg via INTRAVENOUS

## 2016-02-26 MED ORDER — PREDNISONE 20 MG PO TABS: 40.0000 mg | ORAL_TABLET | Freq: Every day | ORAL | 0 refills | Status: AC

## 2016-02-26 MED ORDER — EPINEPHRINE 0.3 MG/0.3ML IJ SOAJ
INTRAMUSCULAR | Status: AC
  Filled 2016-02-26: qty 0.3

## 2016-02-26 NOTE — ED Provider Notes (Signed)
[facility]-EMERGENCY DEPT Provider Note   CSN: [id number] Arrival date & time: 02/26/16  [id number]     History   Chief Complaint Chief Complaint  Patient presents with  . Allergic Reaction    HPI Shawna Martin is a 45 y.o. female.  HPI Patient reports this morning she woke up with dysuria and urinary urgency and possible faint discoloration of her urine.  She is concerned about the possibility of a urinary tract infection that she took an old ciprofloxacin that she had a house.  20-30 minutes later she began with itching and swelling in her armpits as well as the palms of her hands.  She then began having difficulty breathing and was brought emergently to the ER by her husband after giving 50 mg of oral Benadryl at home for possible allergic reaction.  No history of anaphylaxis.  Patient is actively vomiting and swollen in the face on arrival to the ER.   Past Medical History:  Diagnosis Date  . Depression   . Hyperlipemia     There are no active problems to display for this patient.   No past surgical history on file.  OB History    No data available       Home Medications    Prior to Admission medications   Medication Sig Start Date End Date Taking? Authorizing Provider  EPINEPHrine (EPIPEN) 0.3 mg/0.3 mL SOAJ injection Inject 0.3 mLs (0.3 mg total) into the skin once. 12/16/12   Reuben Likesavid C Keller, MD  Escitalopram Oxalate (LEXAPRO PO) Take by mouth.    Historical Provider, MD  oxyCODONE-acetaminophen (PERCOCET) 5-325 MG per tablet 1 to 2 tablets every 6 hours as needed for pain. 12/16/12   Reuben Likesavid C Keller, MD  PRAVASTATIN SODIUM PO Take by mouth.    Historical Provider, MD  predniSONE (DELTASONE) 20 MG tablet 3 daily for 3 days, 2 daily for 3 days, 1 daily for 3 days 12/16/12   Reuben Likesavid C Keller, MD    Family History No family history on file.  Social History Social History  Substance Use Topics  . Smoking status: Current Every Day Smoker    Packs/day: 0.50    Types: Cigarettes   . Smokeless tobacco: Not on file  . Alcohol use Yes     Comment: occasional     Allergies   Ciprofloxacin   Review of Systems Review of Systems  All other systems reviewed and are negative.    Physical Exam Updated Vital Signs BP 151/91   Pulse (!) 56   Resp 20   Ht 5\' 8"  (1.727 m)   Wt 160 lb (72.6 kg)   SpO2 94%   BMI 24.33 kg/m   Physical Exam  Constitutional: She is oriented to person, place, and time. She appears well-developed and well-nourished.  HENT:  Head: Normocephalic and atraumatic.  Facial and lip swelling.  Red from the chest up.  Eyes: Pupils are equal, round, and reactive to light.  Cardiovascular: Regular rhythm.   Pulmonary/Chest: No stridor. She is in respiratory distress.  Abdominal: Soft.  Musculoskeletal: Normal range of motion.  Neurological: She is alert and oriented to person, place, and time.  5/5 strength in major muscle groups of  bilateral upper and lower extremities. Speech normal. No facial asymetry.   Skin:  Diffuse hives  Nursing note and vitals reviewed.    ED Treatments / Results  Labs (all labs ordered are listed, but only abnormal results are displayed) Labs Reviewed  URINALYSIS, ROUTINE W REFLEX  MICROSCOPIC    EKG  EKG Interpretation None       Radiology No results found.  Procedures Procedures (including critical care time)   +++++++++++++++++++++++++++++++++++++++++++++++++++++  CRITICAL CARE Performed by: Lyanne CoAMPOS,Donte Kary M Total critical care time: 30 minutes Critical care time was exclusive of separately billable procedures and treating other patients. Critical care was necessary to treat or prevent imminent or life-threatening deterioration. Critical care was time spent personally by me on the following activities: development of treatment plan with patient and/or surrogate as well as nursing, discussions with consultants, evaluation of patient's response to treatment, examination of patient, obtaining  history from patient or surrogate, ordering and performing treatments and interventions, ordering and review of laboratory studies, ordering and review of radiographic studies, pulse oximetry and re-evaluation of patient's condition.  +++++++++++++++++++++++++++++++++++++++++++++++++++++++++++++++  Medications Ordered in ED Medications  EPINEPHrine (EPI-PEN) 0.3 mg/0.3 mL injection (not administered)  methylPREDNISolone sodium succinate (SOLU-MEDROL) 125 mg/2 mL injection (not administered)  EPINEPHrine (EPI-PEN) injection 0.3 mg (0.3 mg Intramuscular Given 02/26/16 0830)  famotidine (PEPCID) IVPB 20 mg premix (0 mg Intravenous Stopped 02/26/16 0939)  methylPREDNISolone sodium succinate (SOLU-MEDROL) 125 mg/2 mL injection 125 mg (125 mg Intravenous Given 02/26/16 0841)  sodium chloride 0.9 % bolus 1,000 mL (1,000 mLs Intravenous New Bag/Given 02/26/16 0847)  ondansetron (ZOFRAN) injection 4 mg (4 mg Intravenous Given 02/26/16 0847)  diphenhydrAMINE (BENADRYL) injection 50 mg (50 mg Intravenous Given 02/26/16 0847)  fentaNYL (SUBLIMAZE) injection 50 mcg (50 mcg Intravenous Given 02/26/16 0937)     Initial Impression / Assessment and Plan / ED Course  I have reviewed the triage vital signs and the nursing notes.  Pertinent labs & imaging results that were available during my care of the patient were reviewed by me and considered in my medical decision making (see chart for details).  Clinical Course    Patient with severe anaphylaxis on arrival.  Patient immediately brought back to the room and assessed and given IM epinephrine in her left lateral thigh.  She began having improvement.  IV Benadryl, IV Pepcid, IV Solu-Medrol.,  Patient remains on the monitor.  Will continue to follow closely.  She seems to be improving  Patient continues to improve this time.  Her breathing is much better.  10:07 AM Still looks good. Will continue to observe in the ER  12:00 PM Patient feels much better at  this time.  Discharge home in good condition with standard medications including an EpiPen.  She understands to return to the ER for new or worsening symptoms   Final Clinical Impressions(s) / ED Diagnoses   Final diagnoses:  Anaphylaxis, initial encounter    New Prescriptions New Prescriptions   No medications on file     Azalia BilisKevin Adelise Buswell, MD 02/26/16 1200

## 2016-02-26 NOTE — ED Triage Notes (Signed)
Patient believed she had uti and took cipro. Patient developed abd pain, oral swelling, difficulty breathing, and nausea. Patient presented with allergic reaction.

## 2017-01-24 DIAGNOSIS — J301 Allergic rhinitis due to pollen: Secondary | ICD-10-CM | POA: Diagnosis not present

## 2017-01-24 DIAGNOSIS — J3089 Other allergic rhinitis: Secondary | ICD-10-CM | POA: Diagnosis not present

## 2017-01-24 DIAGNOSIS — J3081 Allergic rhinitis due to animal (cat) (dog) hair and dander: Secondary | ICD-10-CM | POA: Diagnosis not present

## 2017-01-26 DIAGNOSIS — J301 Allergic rhinitis due to pollen: Secondary | ICD-10-CM | POA: Diagnosis not present

## 2017-01-26 DIAGNOSIS — J3089 Other allergic rhinitis: Secondary | ICD-10-CM | POA: Diagnosis not present

## 2017-01-26 DIAGNOSIS — J3081 Allergic rhinitis due to animal (cat) (dog) hair and dander: Secondary | ICD-10-CM | POA: Diagnosis not present

## 2017-02-04 DIAGNOSIS — J3081 Allergic rhinitis due to animal (cat) (dog) hair and dander: Secondary | ICD-10-CM | POA: Diagnosis not present

## 2017-02-04 DIAGNOSIS — J301 Allergic rhinitis due to pollen: Secondary | ICD-10-CM | POA: Diagnosis not present

## 2017-02-04 DIAGNOSIS — J3089 Other allergic rhinitis: Secondary | ICD-10-CM | POA: Diagnosis not present

## 2017-02-09 DIAGNOSIS — J3081 Allergic rhinitis due to animal (cat) (dog) hair and dander: Secondary | ICD-10-CM | POA: Diagnosis not present

## 2017-02-09 DIAGNOSIS — J301 Allergic rhinitis due to pollen: Secondary | ICD-10-CM | POA: Diagnosis not present

## 2017-02-09 DIAGNOSIS — J3089 Other allergic rhinitis: Secondary | ICD-10-CM | POA: Diagnosis not present

## 2017-02-22 DIAGNOSIS — N3000 Acute cystitis without hematuria: Secondary | ICD-10-CM | POA: Diagnosis not present

## 2017-02-22 DIAGNOSIS — J3089 Other allergic rhinitis: Secondary | ICD-10-CM | POA: Diagnosis not present

## 2017-02-22 DIAGNOSIS — J301 Allergic rhinitis due to pollen: Secondary | ICD-10-CM | POA: Diagnosis not present

## 2017-02-22 DIAGNOSIS — J3081 Allergic rhinitis due to animal (cat) (dog) hair and dander: Secondary | ICD-10-CM | POA: Diagnosis not present

## 2017-03-02 DIAGNOSIS — J3081 Allergic rhinitis due to animal (cat) (dog) hair and dander: Secondary | ICD-10-CM | POA: Diagnosis not present

## 2017-03-02 DIAGNOSIS — J3089 Other allergic rhinitis: Secondary | ICD-10-CM | POA: Diagnosis not present

## 2017-03-02 DIAGNOSIS — J301 Allergic rhinitis due to pollen: Secondary | ICD-10-CM | POA: Diagnosis not present

## 2017-06-16 DIAGNOSIS — Z23 Encounter for immunization: Secondary | ICD-10-CM | POA: Diagnosis not present

## 2017-06-16 DIAGNOSIS — E7800 Pure hypercholesterolemia, unspecified: Secondary | ICD-10-CM | POA: Diagnosis not present

## 2017-06-16 DIAGNOSIS — Z0000 Encounter for general adult medical examination without abnormal findings: Secondary | ICD-10-CM | POA: Diagnosis not present

## 2017-06-16 DIAGNOSIS — E049 Nontoxic goiter, unspecified: Secondary | ICD-10-CM | POA: Diagnosis not present

## 2017-06-17 ENCOUNTER — Other Ambulatory Visit: Payer: Self-pay | Admitting: Family Medicine

## 2017-06-17 DIAGNOSIS — E049 Nontoxic goiter, unspecified: Secondary | ICD-10-CM

## 2017-07-05 ENCOUNTER — Ambulatory Visit
Admission: RE | Admit: 2017-07-05 | Discharge: 2017-07-05 | Disposition: A | Discharge status: Home / Self Care | Payer: 59 | Source: Ambulatory Visit | Attending: Family Medicine | Admitting: Family Medicine

## 2017-07-05 DIAGNOSIS — E041 Nontoxic single thyroid nodule: Secondary | ICD-10-CM | POA: Diagnosis not present

## 2017-07-05 DIAGNOSIS — E049 Nontoxic goiter, unspecified: Secondary | ICD-10-CM

## 2017-08-23 DIAGNOSIS — J209 Acute bronchitis, unspecified: Secondary | ICD-10-CM | POA: Diagnosis not present

## 2017-09-12 DIAGNOSIS — J301 Allergic rhinitis due to pollen: Secondary | ICD-10-CM | POA: Diagnosis not present

## 2017-09-12 DIAGNOSIS — J3081 Allergic rhinitis due to animal (cat) (dog) hair and dander: Secondary | ICD-10-CM | POA: Diagnosis not present

## 2017-09-12 DIAGNOSIS — J3089 Other allergic rhinitis: Secondary | ICD-10-CM | POA: Diagnosis not present

## 2017-11-14 DIAGNOSIS — M25561 Pain in right knee: Secondary | ICD-10-CM | POA: Diagnosis not present

## 2017-11-17 DIAGNOSIS — M25561 Pain in right knee: Secondary | ICD-10-CM | POA: Diagnosis not present

## 2017-11-23 DIAGNOSIS — B373 Candidiasis of vulva and vagina: Secondary | ICD-10-CM | POA: Diagnosis not present

## 2017-11-25 DIAGNOSIS — M25561 Pain in right knee: Secondary | ICD-10-CM | POA: Diagnosis not present

## 2017-11-25 DIAGNOSIS — M25461 Effusion, right knee: Secondary | ICD-10-CM | POA: Diagnosis not present

## 2017-11-28 DIAGNOSIS — M25561 Pain in right knee: Secondary | ICD-10-CM | POA: Diagnosis not present

## 2017-12-19 DIAGNOSIS — F17200 Nicotine dependence, unspecified, uncomplicated: Secondary | ICD-10-CM | POA: Diagnosis not present

## 2017-12-19 DIAGNOSIS — R7301 Impaired fasting glucose: Secondary | ICD-10-CM | POA: Diagnosis not present

## 2017-12-19 DIAGNOSIS — Z23 Encounter for immunization: Secondary | ICD-10-CM | POA: Diagnosis not present

## 2017-12-19 DIAGNOSIS — E7800 Pure hypercholesterolemia, unspecified: Secondary | ICD-10-CM | POA: Diagnosis not present

## 2018-02-09 DIAGNOSIS — M9905 Segmental and somatic dysfunction of pelvic region: Secondary | ICD-10-CM | POA: Diagnosis not present

## 2018-02-09 DIAGNOSIS — M9903 Segmental and somatic dysfunction of lumbar region: Secondary | ICD-10-CM | POA: Diagnosis not present

## 2018-02-09 DIAGNOSIS — M9904 Segmental and somatic dysfunction of sacral region: Secondary | ICD-10-CM | POA: Diagnosis not present

## 2018-02-13 DIAGNOSIS — M9903 Segmental and somatic dysfunction of lumbar region: Secondary | ICD-10-CM | POA: Diagnosis not present

## 2018-02-13 DIAGNOSIS — M9904 Segmental and somatic dysfunction of sacral region: Secondary | ICD-10-CM | POA: Diagnosis not present

## 2018-02-13 DIAGNOSIS — M9905 Segmental and somatic dysfunction of pelvic region: Secondary | ICD-10-CM | POA: Diagnosis not present

## 2018-02-14 DIAGNOSIS — M9905 Segmental and somatic dysfunction of pelvic region: Secondary | ICD-10-CM | POA: Diagnosis not present

## 2018-02-14 DIAGNOSIS — M9904 Segmental and somatic dysfunction of sacral region: Secondary | ICD-10-CM | POA: Diagnosis not present

## 2018-02-14 DIAGNOSIS — M9903 Segmental and somatic dysfunction of lumbar region: Secondary | ICD-10-CM | POA: Diagnosis not present

## 2018-02-15 DIAGNOSIS — M9905 Segmental and somatic dysfunction of pelvic region: Secondary | ICD-10-CM | POA: Diagnosis not present

## 2018-02-15 DIAGNOSIS — M9903 Segmental and somatic dysfunction of lumbar region: Secondary | ICD-10-CM | POA: Diagnosis not present

## 2018-02-15 DIAGNOSIS — M9904 Segmental and somatic dysfunction of sacral region: Secondary | ICD-10-CM | POA: Diagnosis not present

## 2018-02-20 DIAGNOSIS — M9905 Segmental and somatic dysfunction of pelvic region: Secondary | ICD-10-CM | POA: Diagnosis not present

## 2018-02-20 DIAGNOSIS — M9904 Segmental and somatic dysfunction of sacral region: Secondary | ICD-10-CM | POA: Diagnosis not present

## 2018-02-20 DIAGNOSIS — M9903 Segmental and somatic dysfunction of lumbar region: Secondary | ICD-10-CM | POA: Diagnosis not present

## 2018-02-22 DIAGNOSIS — M9905 Segmental and somatic dysfunction of pelvic region: Secondary | ICD-10-CM | POA: Diagnosis not present

## 2018-02-22 DIAGNOSIS — M9904 Segmental and somatic dysfunction of sacral region: Secondary | ICD-10-CM | POA: Diagnosis not present

## 2018-02-22 DIAGNOSIS — M9903 Segmental and somatic dysfunction of lumbar region: Secondary | ICD-10-CM | POA: Diagnosis not present

## 2018-02-28 DIAGNOSIS — M9905 Segmental and somatic dysfunction of pelvic region: Secondary | ICD-10-CM | POA: Diagnosis not present

## 2018-02-28 DIAGNOSIS — M9903 Segmental and somatic dysfunction of lumbar region: Secondary | ICD-10-CM | POA: Diagnosis not present

## 2018-02-28 DIAGNOSIS — M9904 Segmental and somatic dysfunction of sacral region: Secondary | ICD-10-CM | POA: Diagnosis not present

## 2018-03-02 DIAGNOSIS — M9905 Segmental and somatic dysfunction of pelvic region: Secondary | ICD-10-CM | POA: Diagnosis not present

## 2018-03-02 DIAGNOSIS — M9904 Segmental and somatic dysfunction of sacral region: Secondary | ICD-10-CM | POA: Diagnosis not present

## 2018-03-02 DIAGNOSIS — M9903 Segmental and somatic dysfunction of lumbar region: Secondary | ICD-10-CM | POA: Diagnosis not present

## 2018-03-08 DIAGNOSIS — M9905 Segmental and somatic dysfunction of pelvic region: Secondary | ICD-10-CM | POA: Diagnosis not present

## 2018-03-08 DIAGNOSIS — M9903 Segmental and somatic dysfunction of lumbar region: Secondary | ICD-10-CM | POA: Diagnosis not present

## 2018-03-08 DIAGNOSIS — M9904 Segmental and somatic dysfunction of sacral region: Secondary | ICD-10-CM | POA: Diagnosis not present

## 2018-03-16 DIAGNOSIS — L739 Follicular disorder, unspecified: Secondary | ICD-10-CM | POA: Diagnosis not present

## 2018-03-30 DIAGNOSIS — M9904 Segmental and somatic dysfunction of sacral region: Secondary | ICD-10-CM | POA: Diagnosis not present

## 2018-03-30 DIAGNOSIS — M9905 Segmental and somatic dysfunction of pelvic region: Secondary | ICD-10-CM | POA: Diagnosis not present

## 2018-03-30 DIAGNOSIS — M9903 Segmental and somatic dysfunction of lumbar region: Secondary | ICD-10-CM | POA: Diagnosis not present

## 2018-04-06 DIAGNOSIS — M9905 Segmental and somatic dysfunction of pelvic region: Secondary | ICD-10-CM | POA: Diagnosis not present

## 2018-04-06 DIAGNOSIS — M9903 Segmental and somatic dysfunction of lumbar region: Secondary | ICD-10-CM | POA: Diagnosis not present

## 2018-04-06 DIAGNOSIS — M9904 Segmental and somatic dysfunction of sacral region: Secondary | ICD-10-CM | POA: Diagnosis not present

## 2018-05-09 DIAGNOSIS — Z118 Encounter for screening for other infectious and parasitic diseases: Secondary | ICD-10-CM | POA: Diagnosis not present

## 2018-05-09 DIAGNOSIS — N9089 Other specified noninflammatory disorders of vulva and perineum: Secondary | ICD-10-CM | POA: Diagnosis not present

## 2018-05-09 DIAGNOSIS — B373 Candidiasis of vulva and vagina: Secondary | ICD-10-CM | POA: Diagnosis not present

## 2018-05-09 DIAGNOSIS — N760 Acute vaginitis: Secondary | ICD-10-CM | POA: Diagnosis not present

## 2018-05-19 DIAGNOSIS — Z6830 Body mass index (BMI) 30.0-30.9, adult: Secondary | ICD-10-CM | POA: Diagnosis not present

## 2018-05-19 DIAGNOSIS — Z01419 Encounter for gynecological examination (general) (routine) without abnormal findings: Secondary | ICD-10-CM | POA: Diagnosis not present

## 2018-06-29 DIAGNOSIS — Z0000 Encounter for general adult medical examination without abnormal findings: Secondary | ICD-10-CM | POA: Diagnosis not present

## 2018-06-29 DIAGNOSIS — Z6831 Body mass index (BMI) 31.0-31.9, adult: Secondary | ICD-10-CM | POA: Diagnosis not present

## 2018-06-29 DIAGNOSIS — L659 Nonscarring hair loss, unspecified: Secondary | ICD-10-CM | POA: Diagnosis not present

## 2018-06-29 DIAGNOSIS — Z13220 Encounter for screening for lipoid disorders: Secondary | ICD-10-CM | POA: Diagnosis not present

## 2018-06-29 DIAGNOSIS — Z8342 Family history of familial hypercholesterolemia: Secondary | ICD-10-CM | POA: Diagnosis not present

## 2018-06-29 DIAGNOSIS — E559 Vitamin D deficiency, unspecified: Secondary | ICD-10-CM | POA: Diagnosis not present

## 2019-01-15 DIAGNOSIS — M5416 Radiculopathy, lumbar region: Secondary | ICD-10-CM | POA: Diagnosis not present

## 2019-01-15 DIAGNOSIS — M5116 Intervertebral disc disorders with radiculopathy, lumbar region: Secondary | ICD-10-CM | POA: Diagnosis not present

## 2019-01-15 DIAGNOSIS — M79604 Pain in right leg: Secondary | ICD-10-CM | POA: Diagnosis not present

## 2019-01-22 DIAGNOSIS — M5416 Radiculopathy, lumbar region: Secondary | ICD-10-CM | POA: Diagnosis not present

## 2019-01-22 DIAGNOSIS — M5116 Intervertebral disc disorders with radiculopathy, lumbar region: Secondary | ICD-10-CM | POA: Diagnosis not present

## 2019-01-22 DIAGNOSIS — M79604 Pain in right leg: Secondary | ICD-10-CM | POA: Diagnosis not present

## 2019-01-29 DIAGNOSIS — M5416 Radiculopathy, lumbar region: Secondary | ICD-10-CM | POA: Diagnosis not present

## 2019-01-29 DIAGNOSIS — M79604 Pain in right leg: Secondary | ICD-10-CM | POA: Diagnosis not present

## 2019-01-29 DIAGNOSIS — M5116 Intervertebral disc disorders with radiculopathy, lumbar region: Secondary | ICD-10-CM | POA: Diagnosis not present

## 2019-02-05 DIAGNOSIS — M5416 Radiculopathy, lumbar region: Secondary | ICD-10-CM | POA: Diagnosis not present

## 2019-02-05 DIAGNOSIS — M5116 Intervertebral disc disorders with radiculopathy, lumbar region: Secondary | ICD-10-CM | POA: Diagnosis not present

## 2019-02-05 DIAGNOSIS — M79604 Pain in right leg: Secondary | ICD-10-CM | POA: Diagnosis not present

## 2019-02-26 DIAGNOSIS — M545 Low back pain: Secondary | ICD-10-CM | POA: Diagnosis not present

## 2019-02-26 DIAGNOSIS — M5416 Radiculopathy, lumbar region: Secondary | ICD-10-CM | POA: Diagnosis not present

## 2019-02-26 DIAGNOSIS — M5116 Intervertebral disc disorders with radiculopathy, lumbar region: Secondary | ICD-10-CM | POA: Diagnosis not present

## 2019-03-09 DIAGNOSIS — M5116 Intervertebral disc disorders with radiculopathy, lumbar region: Secondary | ICD-10-CM | POA: Diagnosis not present

## 2019-03-09 DIAGNOSIS — M5416 Radiculopathy, lumbar region: Secondary | ICD-10-CM | POA: Diagnosis not present

## 2019-03-09 DIAGNOSIS — M545 Low back pain: Secondary | ICD-10-CM | POA: Diagnosis not present

## 2019-03-22 DIAGNOSIS — M5116 Intervertebral disc disorders with radiculopathy, lumbar region: Secondary | ICD-10-CM | POA: Diagnosis not present

## 2019-03-22 DIAGNOSIS — M545 Low back pain: Secondary | ICD-10-CM | POA: Diagnosis not present

## 2019-03-22 DIAGNOSIS — M5416 Radiculopathy, lumbar region: Secondary | ICD-10-CM | POA: Diagnosis not present

## 2019-04-04 DIAGNOSIS — M5416 Radiculopathy, lumbar region: Secondary | ICD-10-CM | POA: Diagnosis not present

## 2019-04-04 DIAGNOSIS — M545 Low back pain: Secondary | ICD-10-CM | POA: Diagnosis not present

## 2019-04-04 DIAGNOSIS — M5116 Intervertebral disc disorders with radiculopathy, lumbar region: Secondary | ICD-10-CM | POA: Diagnosis not present

## 2019-04-30 DIAGNOSIS — M545 Low back pain: Secondary | ICD-10-CM | POA: Diagnosis not present

## 2019-04-30 DIAGNOSIS — M5116 Intervertebral disc disorders with radiculopathy, lumbar region: Secondary | ICD-10-CM | POA: Diagnosis not present

## 2019-04-30 DIAGNOSIS — M5416 Radiculopathy, lumbar region: Secondary | ICD-10-CM | POA: Diagnosis not present

## 2019-06-11 DIAGNOSIS — F17200 Nicotine dependence, unspecified, uncomplicated: Secondary | ICD-10-CM | POA: Diagnosis not present

## 2019-06-11 DIAGNOSIS — F418 Other specified anxiety disorders: Secondary | ICD-10-CM | POA: Diagnosis not present

## 2019-06-11 DIAGNOSIS — E7800 Pure hypercholesterolemia, unspecified: Secondary | ICD-10-CM | POA: Diagnosis not present

## 2019-06-11 DIAGNOSIS — G43109 Migraine with aura, not intractable, without status migrainosus: Secondary | ICD-10-CM | POA: Diagnosis not present

## 2019-07-01 IMAGING — US US THYROID
1 series · 14 of 25 positions shown · non-contrast
Comparison: 08/05/2011

CLINICAL DATA: Prior ultrasound follow-up.  Follow-up nodule.

EXAM:
THYROID ULTRASOUND
TECHNIQUE: Ultrasound examination of the thyroid gland and adjacent soft
tissues was performed.

[Series 1: us thyroid · 0.07mm/px · 14 of 55 slices shown]
[im 1/55]
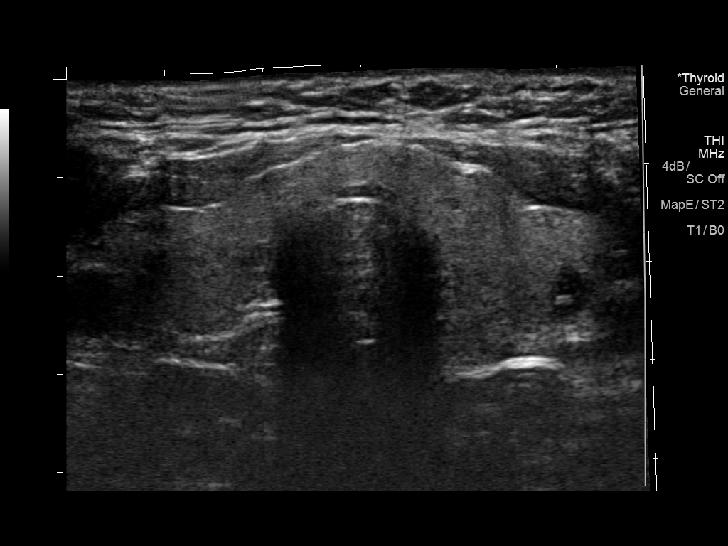
[im 5/55]
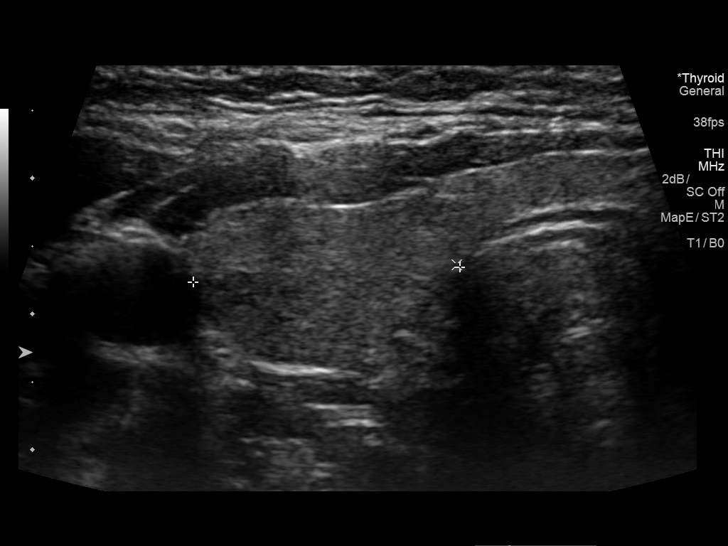
[im 10/55]
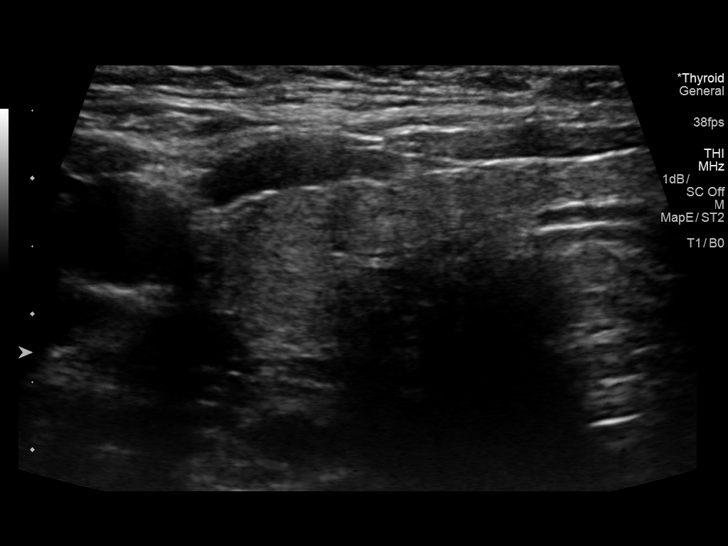
[im 14/55]
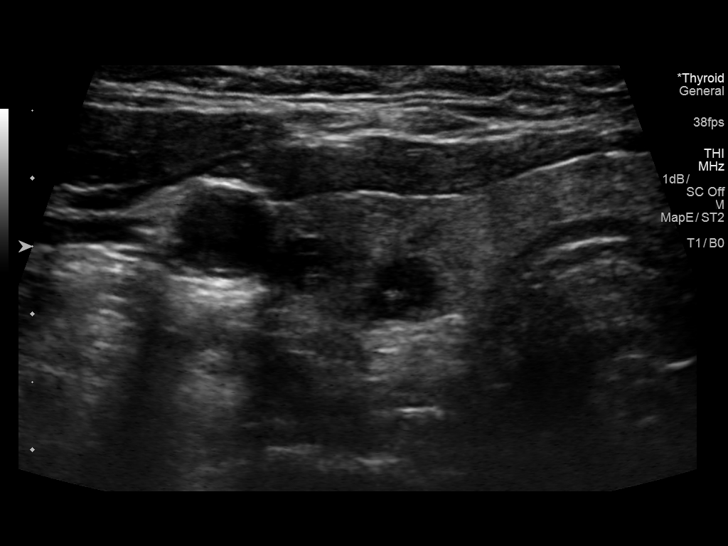
[im 19/55]
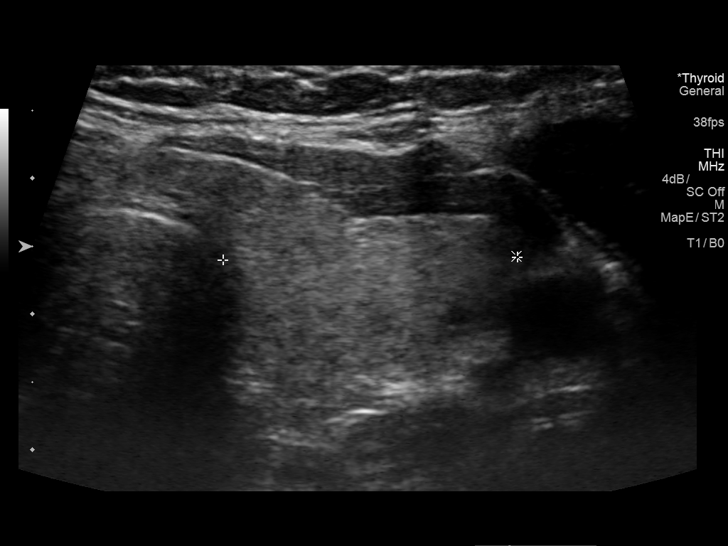
[im 21/55]
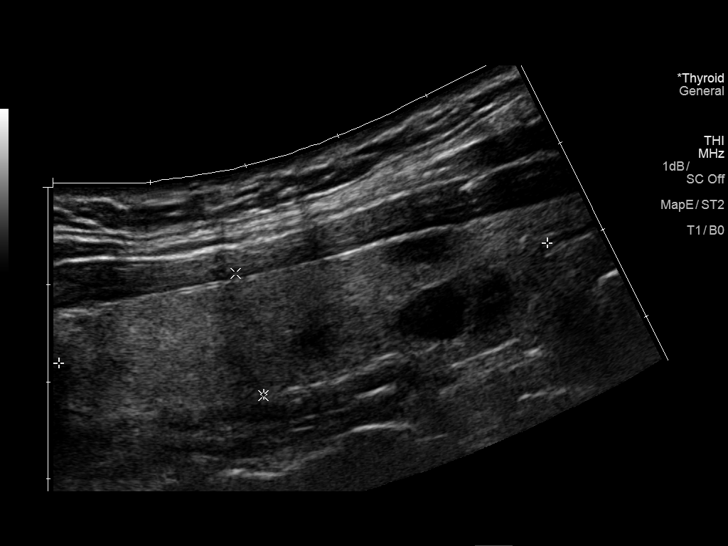
[im 25/55]
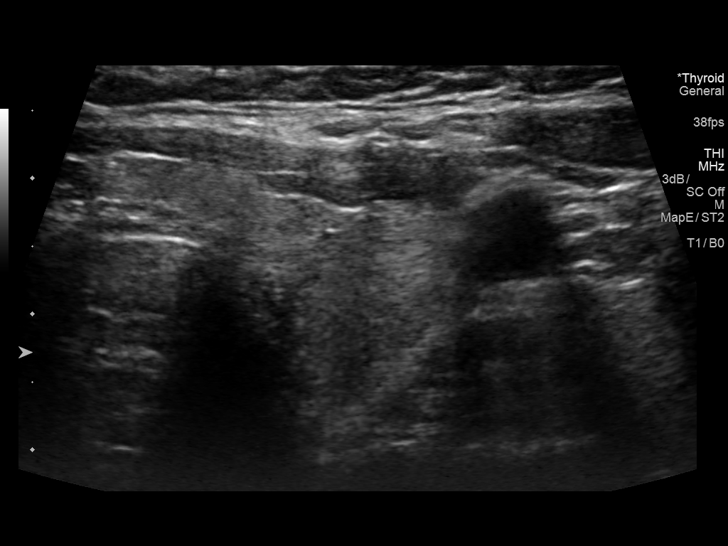
[im 30/55]
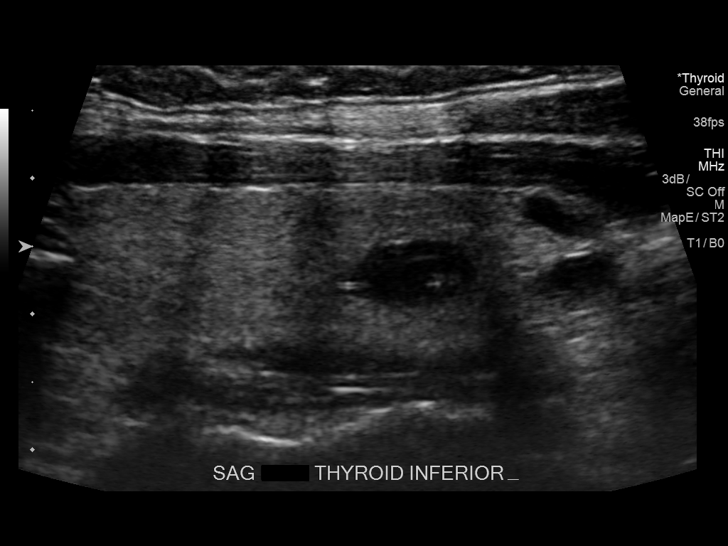
[im 34/55]
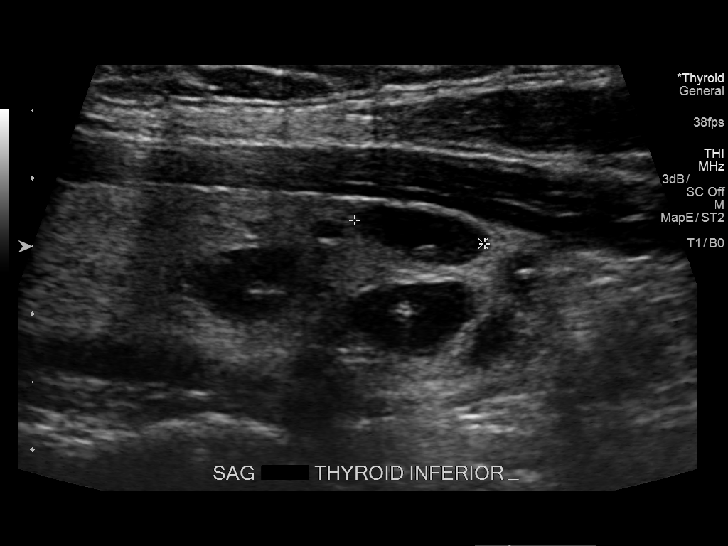
[im 37/55]
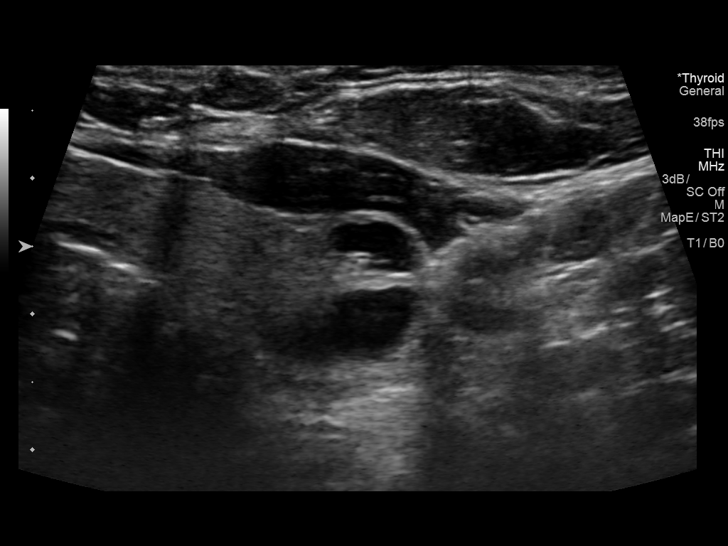
[im 41/55]
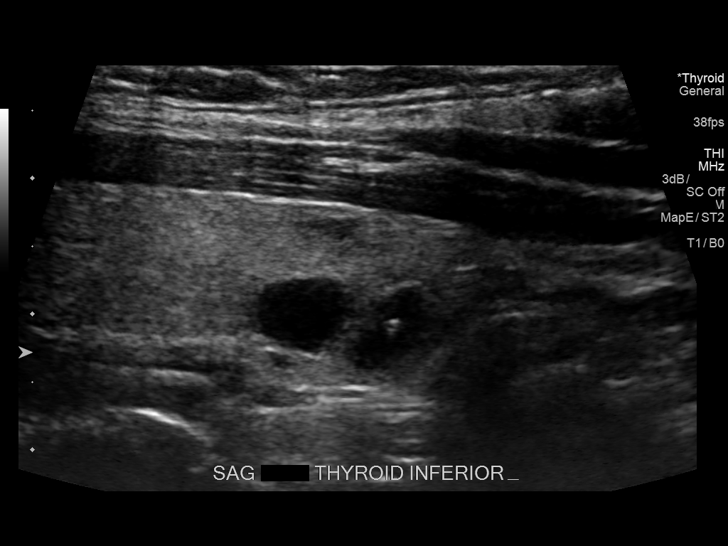
[im 46/55]
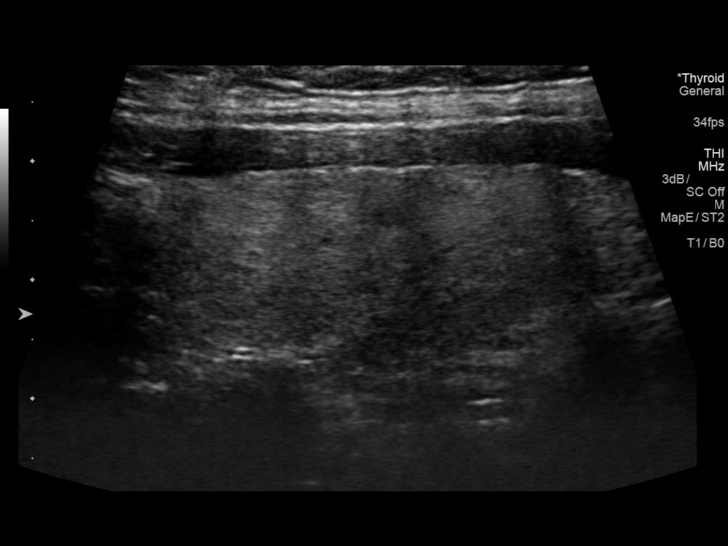
[im 50/55]
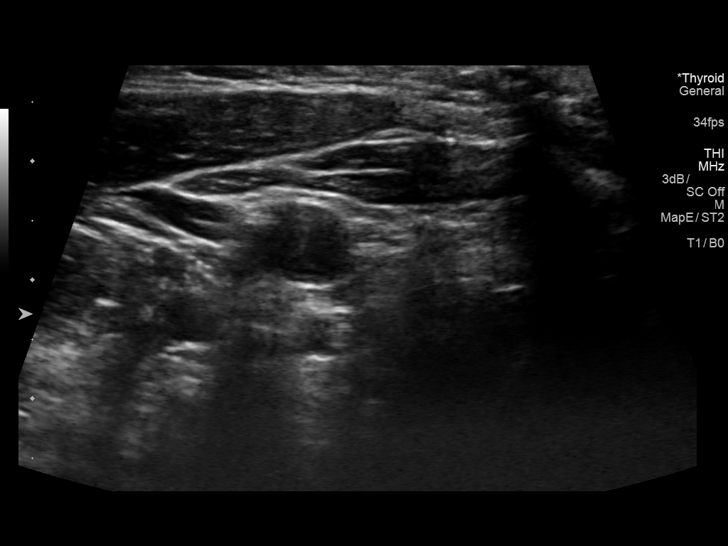
[im 55/55]
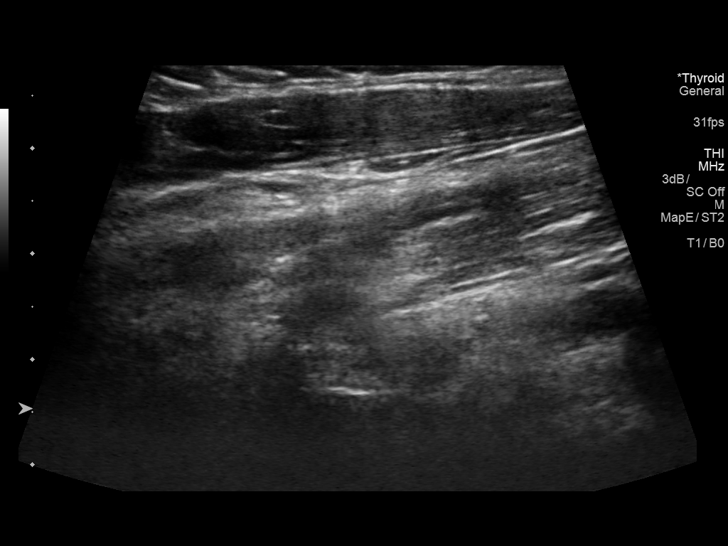

[14 of 25 positions shown; findings below may reference images not displayed]

FINDINGS: Parenchymal Echotexture: Normal

Isthmus: 0.5 cm, previously 0.6 cm

Right lobe: 5.1 x 1.3 x 2.0 cm, previously 4.7 x 1.2 x 1.7 cm

Left lobe: 5.2 x 1.3 x 2.2 cm, previously 6.0 x 1.2 x 1.9 cm

_________________________________________________________

Estimated total number of nodules >/= 1 cm: 0

Number of spongiform nodules >/=  2 cm not described below (TR1): 0

Number of mixed cystic and solid nodules >/= 1.5 cm not described
below (TR2): 0

_________________________________________________________

Several cysts in both lobes of the thyroid gland measure 0.9 cm or
less and do not meet criteria for biopsy nor follow-up. Overall, the
findings are not significantly changed.
IMPRESSION: Small cysts do not meet criteria for biopsy nor follow-up. The
examination is not significantly changed.

The above is in keeping with the ACR TI-RADS recommendations - [HOSPITAL] 3637;[DATE].

## 2019-07-03 DIAGNOSIS — H0014 Chalazion left upper eyelid: Secondary | ICD-10-CM | POA: Diagnosis not present

## 2019-07-05 DIAGNOSIS — H0014 Chalazion left upper eyelid: Secondary | ICD-10-CM | POA: Diagnosis not present

## 2019-07-17 DIAGNOSIS — Z1231 Encounter for screening mammogram for malignant neoplasm of breast: Secondary | ICD-10-CM | POA: Diagnosis not present

## 2019-07-17 DIAGNOSIS — Z6833 Body mass index (BMI) 33.0-33.9, adult: Secondary | ICD-10-CM | POA: Diagnosis not present

## 2019-07-17 DIAGNOSIS — Z01419 Encounter for gynecological examination (general) (routine) without abnormal findings: Secondary | ICD-10-CM | POA: Diagnosis not present

## 2019-10-16 DIAGNOSIS — E7800 Pure hypercholesterolemia, unspecified: Secondary | ICD-10-CM | POA: Diagnosis not present

## 2019-10-16 DIAGNOSIS — E042 Nontoxic multinodular goiter: Secondary | ICD-10-CM | POA: Diagnosis not present

## 2019-10-16 DIAGNOSIS — Z0000 Encounter for general adult medical examination without abnormal findings: Secondary | ICD-10-CM | POA: Diagnosis not present

## 2019-10-16 DIAGNOSIS — E559 Vitamin D deficiency, unspecified: Secondary | ICD-10-CM | POA: Diagnosis not present

## 2019-10-16 DIAGNOSIS — I10 Essential (primary) hypertension: Secondary | ICD-10-CM | POA: Diagnosis not present

## 2020-02-07 DIAGNOSIS — R300 Dysuria: Secondary | ICD-10-CM | POA: Diagnosis not present

## 2020-02-12 DIAGNOSIS — H35713 Central serous chorioretinopathy, bilateral: Secondary | ICD-10-CM | POA: Diagnosis not present

## 2020-03-19 DIAGNOSIS — Z1159 Encounter for screening for other viral diseases: Secondary | ICD-10-CM | POA: Diagnosis not present

## 2020-03-24 DIAGNOSIS — Z1211 Encounter for screening for malignant neoplasm of colon: Secondary | ICD-10-CM | POA: Diagnosis not present

## 2020-03-25 DIAGNOSIS — H35713 Central serous chorioretinopathy, bilateral: Secondary | ICD-10-CM | POA: Diagnosis not present

## 2020-04-10 DIAGNOSIS — H35713 Central serous chorioretinopathy, bilateral: Secondary | ICD-10-CM | POA: Diagnosis not present

## 2020-04-10 DIAGNOSIS — H35033 Hypertensive retinopathy, bilateral: Secondary | ICD-10-CM | POA: Diagnosis not present

## 2020-05-12 DIAGNOSIS — M79644 Pain in right finger(s): Secondary | ICD-10-CM | POA: Diagnosis not present

## 2020-07-10 DIAGNOSIS — H35033 Hypertensive retinopathy, bilateral: Secondary | ICD-10-CM | POA: Diagnosis not present

## 2020-07-10 DIAGNOSIS — H2513 Age-related nuclear cataract, bilateral: Secondary | ICD-10-CM | POA: Diagnosis not present

## 2020-07-10 DIAGNOSIS — H35713 Central serous chorioretinopathy, bilateral: Secondary | ICD-10-CM | POA: Diagnosis not present

## 2020-07-22 DIAGNOSIS — M25561 Pain in right knee: Secondary | ICD-10-CM | POA: Diagnosis not present

## 2020-08-12 ENCOUNTER — Other Ambulatory Visit: Payer: Self-pay | Admitting: Ophthalmology

## 2024-03-08 ENCOUNTER — Other Ambulatory Visit (HOSPITAL_BASED_OUTPATIENT_CLINIC_OR_DEPARTMENT_OTHER): Payer: Self-pay

## 2024-03-08 DIAGNOSIS — E782 Mixed hyperlipidemia: Secondary | ICD-10-CM

## 2024-04-11 ENCOUNTER — Encounter (HOSPITAL_BASED_OUTPATIENT_CLINIC_OR_DEPARTMENT_OTHER): Payer: Self-pay

## 2024-04-11 ENCOUNTER — Other Ambulatory Visit (HOSPITAL_BASED_OUTPATIENT_CLINIC_OR_DEPARTMENT_OTHER)
